# Patient Record
Sex: Male | Born: 1975 | Race: White | Hispanic: No | Marital: Married | Smoking: Never smoker
Health system: Southern US, Community
[De-identification: ages and names within clinical notes are randomized; demographics above are authoritative.]

## PROBLEM LIST (undated history)

## (undated) DIAGNOSIS — E7800 Pure hypercholesterolemia, unspecified: Secondary | ICD-10-CM

## (undated) DIAGNOSIS — J45909 Unspecified asthma, uncomplicated: Secondary | ICD-10-CM

## (undated) DIAGNOSIS — F429 Obsessive-compulsive disorder, unspecified: Secondary | ICD-10-CM

## (undated) DIAGNOSIS — J302 Other seasonal allergic rhinitis: Secondary | ICD-10-CM

## (undated) DIAGNOSIS — E559 Vitamin D deficiency, unspecified: Secondary | ICD-10-CM

## (undated) DIAGNOSIS — T7840XA Allergy, unspecified, initial encounter: Secondary | ICD-10-CM

## (undated) DIAGNOSIS — F411 Generalized anxiety disorder: Secondary | ICD-10-CM

## (undated) DIAGNOSIS — K529 Noninfective gastroenteritis and colitis, unspecified: Secondary | ICD-10-CM

## (undated) HISTORY — PX: COLONOSCOPY W/ POLYPECTOMY: SHX1380

## (undated) HISTORY — PX: OTHER SURGICAL HISTORY: SHX169

## (undated) HISTORY — DX: Generalized anxiety disorder: F41.1

## (undated) HISTORY — DX: Pure hypercholesterolemia, unspecified: E78.00

## (undated) HISTORY — DX: Obsessive-compulsive disorder, unspecified: F42.9

## (undated) HISTORY — DX: Allergy, unspecified, initial encounter: T78.40XA

## (undated) HISTORY — DX: Unspecified asthma, uncomplicated: J45.909

## (undated) HISTORY — PX: VASECTOMY: SHX75

## (undated) HISTORY — DX: Other seasonal allergic rhinitis: J30.2

## (undated) HISTORY — DX: Vitamin D deficiency, unspecified: E55.9

---
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
## 2017-12-25 HISTORY — PX: CARDIOVASCULAR STRESS TEST: SHX262

## 2018-01-09 ENCOUNTER — Ambulatory Visit (INDEPENDENT_AMBULATORY_CARE_PROVIDER_SITE_OTHER): Payer: 59 | Admitting: Family Medicine

## 2018-01-09 ENCOUNTER — Encounter: Payer: Self-pay | Admitting: Family Medicine

## 2018-01-09 VITALS — BP 133/84 | HR 65 | Temp 98.3°F | Resp 16 | Ht 64.5 in | Wt 143.0 lb

## 2018-01-09 DIAGNOSIS — R072 Precordial pain: Secondary | ICD-10-CM

## 2018-01-09 NOTE — Progress Notes (Signed)
Office Note 01/09/2018  CC:  Chief Complaint  Patient presents with  . Establish Care    HPI:  Dennis [name] is a 42 y.o. male who is here to establish care Patient's most recent primary MD: one in [place]. Old records were not reviewed prior to or during today's visit.  About 3 weeks ago he was doing vigorous exercise and about 5 min into this he began to feel mid back pain associated also with L sided CP and L arm numbness.  No jaw pain.  No nausea.  Some lightheaded feeling.  No cough, wheeze, or chest tightness.  Lasted 20 min while he continued to exercise.  Subsided after 5 min of rest.  No recurrence since.  He has not worked out anymore but mainly due to having cold virus.  Denies GERD. Has been a runner prior to the CP episode and described feeling a bit of chest discomfort at beginning but this resolved as he continued to run.  Lots of life changes in last 6 mo or so.  Past Medical History:  Diagnosis Date  . Asthma    childhood only.  Marland Kitchen GAD (generalized anxiety disorder)   . OCD (obsessive compulsive disorder)    tendencies.  ADD sx's as well.  . Seasonal allergies   . Vitamin D deficiency     Past Surgical History:  Procedure Laterality Date  . VASECTOMY    . vasectomy reverasal      Family History  Problem Relation Age of Onset  . Cancer Mother   . Depression Mother   . Stroke Mother   . Stroke Father   . Hypertension Father   . Early death Father   . Arthritis Maternal Grandmother   . Cancer Maternal Grandmother   . Early death Maternal Grandmother   . Alcohol abuse Maternal Grandfather   . Heart disease Maternal Grandfather   . Stroke Maternal Grandfather   . Cancer Paternal Grandfather   . COPD Paternal Grandfather     Social History   Socioeconomic History  . Marital status: Married    Spouse name: Not on file  . Number of children: Not on file  . Years of education: Not on file  . Highest education level: Not on file  Social  Needs  . Financial resource strain: Not on file  . Food insecurity - worry: Not on file  . Food insecurity - inability: Not on file  . Transportation needs - medical: Not on file  . Transportation needs - non-medical: Not on file  Occupational History  . Not on file  Tobacco Use  . Smoking status: Never Smoker  . Smokeless tobacco: Never Used  Substance and Sexual Activity  . Alcohol use: No    Frequency: Never  . Drug use: No  . Sexual activity: Not on file  Other Topics Concern  . Not on file  Social History Narrative   Married, 3 daughters and 1 son.  Pt w/out siblings.   Relocated to [place] from [place] 2018.   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   No T/A/Ds.    Outpatient Encounter Medications as of 01/09/2018  Medication Sig  . Cholecalciferol (VITAMIN D3) 5000 units CAPS Take by mouth.  . Docosahexaenoic Acid (DHA) 200 MG CAPS Take by mouth.  . Saccharomyces boulardii (PROBIOTIC) 250 MG CAPS Take by mouth.  . sertraline (ZOLOFT) 50 MG tablet    No facility-administered encounter medications on file as of 01/09/2018.  Not on File  ROS Review of Systems  Constitutional: Negative for fatigue and fever.  HENT: Negative for congestion and sore throat.   Eyes: Negative for visual disturbance.  Respiratory: Negative for cough.   Cardiovascular: Positive for chest pain (see hpi).  Gastrointestinal: Negative for abdominal pain and nausea.  Genitourinary: Negative for dysuria.  Musculoskeletal: Negative for back pain and joint swelling.  Skin: Negative for rash.  Neurological: Negative for weakness and headaches.  Hematological: Negative for adenopathy.    PE; Blood pressure 133/84, pulse 65, temperature 98.3 F (36.8 C), temperature source Oral, resp. rate 16, height 5' 4.5" (1.638 m), weight 143 lb (64.9 kg), SpO2 97 %. Body mass index is 24.17 kg/m.  Gen: Alert, well appearing.  Patient is oriented to person, place,  time, and situation. AFFECT: pleasant, lucid thought and speech. HWT:UUEK: no injection, icteris, swelling, or exudate.  EOMI, PERRLA. Mouth: lips without lesion/swelling.  Oral mucosa pink and moist. Oropharynx without erythema, exudate, or swelling.  CV: RRR, no m/r/g.   LUNGS: CTA bilat, nonlabored resps, good aeration in all lung fields. ABD: soft, NT, ND, BS normal. No bruits. EXT: no clubbing, cyanosis, or edema.   Pertinent labs:   12 lead EKG today: (no prior for comparison)-NSR, rate 66, normal intervals and duration, no ischemic changes or ectopy.  No hypertrophy.  ASSESSMENT AND PLAN:   New pt: no pertinent old records to obtain.  1) Chest pain, exercise induced. EKG normal today. Will obtain fasting lipids, CMET, TSH, and CBC. Will order ETT. Musculoskeletal pain is a distinct possibility but given description of the pain, will further eval (as noted above) for cardiac ischemia.  An After Visit Summary was printed and given to the patient.  Return in about 4 weeks (around 02/06/2018) for CPE (fasting not necessary)--needs lab visit for fasting labs at his earliest convenience.  Signed:  Crissie Sickles, MD           01/09/2018

## 2018-01-15 ENCOUNTER — Ambulatory Visit (INDEPENDENT_AMBULATORY_CARE_PROVIDER_SITE_OTHER): Payer: 59

## 2018-01-15 DIAGNOSIS — R072 Precordial pain: Secondary | ICD-10-CM

## 2018-01-15 LAB — EXERCISE TOLERANCE TEST
CHL CUP MPHR: 178 {beats}/min
CHL CUP RESTING HR STRESS: 64 {beats}/min
CSEPHR: 101 %
CSEPPHR: 181 {beats}/min
Estimated workload: 17.3 METS
Exercise duration (min): 14 min
Exercise duration (sec): 1 s
RPE: 17

## 2018-01-16 ENCOUNTER — Encounter: Payer: Self-pay | Admitting: Family Medicine

## 2018-01-22 ENCOUNTER — Other Ambulatory Visit (INDEPENDENT_AMBULATORY_CARE_PROVIDER_SITE_OTHER): Payer: 59

## 2018-01-22 DIAGNOSIS — R072 Precordial pain: Secondary | ICD-10-CM

## 2018-01-23 LAB — CBC WITH DIFFERENTIAL/PLATELET
BASOS ABS: 32 {cells}/uL (ref 0–200)
Basophils Relative: 0.7 %
EOS ABS: 171 {cells}/uL (ref 15–500)
Eosinophils Relative: 3.8 %
HCT: 47.3 % (ref 38.5–50.0)
Hemoglobin: 16.4 g/dL (ref 13.2–17.1)
Lymphs Abs: 1260 cells/uL (ref 850–3900)
MCH: 30.5 pg (ref 27.0–33.0)
MCHC: 34.7 g/dL (ref 32.0–36.0)
MCV: 88.1 fL (ref 80.0–100.0)
MONOS PCT: 8.7 %
MPV: 10.1 fL (ref 7.5–12.5)
Neutro Abs: 2646 cells/uL (ref 1500–7800)
Neutrophils Relative %: 58.8 %
PLATELETS: 215 10*3/uL (ref 140–400)
RBC: 5.37 10*6/uL (ref 4.20–5.80)
RDW: 11.9 % (ref 11.0–15.0)
TOTAL LYMPHOCYTE: 28 %
WBC mixed population: 392 cells/uL (ref 200–950)
WBC: 4.5 10*3/uL (ref 3.8–10.8)

## 2018-01-23 LAB — COMPREHENSIVE METABOLIC PANEL
AG RATIO: 2 (calc) (ref 1.0–2.5)
ALT: 20 U/L (ref 9–46)
AST: 22 U/L (ref 10–40)
Albumin: 4.6 g/dL (ref 3.6–5.1)
Alkaline phosphatase (APISO): 48 U/L (ref 40–115)
BUN: 17 mg/dL (ref 7–25)
CO2: 26 mmol/L (ref 20–32)
Calcium: 9.5 mg/dL (ref 8.6–10.3)
Chloride: 105 mmol/L (ref 98–110)
Creat: 0.93 mg/dL (ref 0.60–1.35)
GLUCOSE: 89 mg/dL (ref 65–99)
Globulin: 2.3 g/dL (calc) (ref 1.9–3.7)
Potassium: 4.5 mmol/L (ref 3.5–5.3)
SODIUM: 140 mmol/L (ref 135–146)
TOTAL PROTEIN: 6.9 g/dL (ref 6.1–8.1)
Total Bilirubin: 0.9 mg/dL (ref 0.2–1.2)

## 2018-01-23 LAB — LIPID PANEL
Cholesterol: 193 mg/dL (ref <200)
HDL: 42 mg/dL (ref >40)
LDL CHOLESTEROL (CALC): 134 mg/dL — AB
Non-HDL Cholesterol (Calc): 151 mg/dL (calc) — ABNORMAL HIGH (ref <130)
Total CHOL/HDL Ratio: 4.6 (calc) (ref <5.0)
Triglycerides: 75 mg/dL (ref <150)

## 2018-01-23 LAB — TSH: TSH: 1.78 m[IU]/L (ref 0.40–4.50)

## 2018-02-05 ENCOUNTER — Ambulatory Visit (INDEPENDENT_AMBULATORY_CARE_PROVIDER_SITE_OTHER): Payer: 59 | Admitting: Family Medicine

## 2018-02-05 ENCOUNTER — Encounter: Payer: Self-pay | Admitting: Family Medicine

## 2018-02-05 VITALS — BP 128/84 | HR 67 | Temp 97.8°F | Resp 15 | Ht 64.5 in | Wt 143.0 lb

## 2018-02-05 DIAGNOSIS — Z0000 Encounter for general adult medical examination without abnormal findings: Secondary | ICD-10-CM

## 2018-02-05 MED ORDER — SERTRALINE HCL 50 MG PO TABS: ORAL_TABLET | ORAL | 3 refills | Status: DC

## 2018-02-05 NOTE — Progress Notes (Signed)
Office Note 02/05/2018  CC:  Chief Complaint  Patient presents with  . Annual Exam    HPI:  Dennis [name] is a 42 y.o. White male who is here for annual health maintenance exam. He had a fasting health panel of labs 01/22/18 and it was all normal--results reviewed in detail with pt today.  Eyes: this year. Dental: preventatives UTD. Exercise: [place]--high intensity training/agility/CV/core strengthening, running ok. Diet: "paleo-ish" per pt report, gluten free.  Past Medical History:  Diagnosis Date  . Asthma    childhood only.  Marland Kitchen GAD (generalized anxiety disorder)   . OCD (obsessive compulsive disorder)    tendencies.  ADD sx's as well.  . Seasonal allergies   . Vitamin D deficiency     Past Surgical History:  Procedure Laterality Date  . CARDIOVASCULAR STRESS TEST  12/2017   ETT--NORMAL  . VASECTOMY    . vasectomy reverasal      Family History  Problem Relation Age of Onset  . Cancer Mother   . Depression Mother   . Stroke Mother   . Stroke Father   . Hypertension Father   . Early death Father   . Arthritis Maternal Grandmother   . Cancer Maternal Grandmother   . Early death Maternal Grandmother   . Alcohol abuse Maternal Grandfather   . Heart disease Maternal Grandfather   . Stroke Maternal Grandfather   . Cancer Paternal Grandfather   . COPD Paternal Grandfather     Social History   Socioeconomic History  . Marital status: Married    Spouse name: Not on file  . Number of children: Not on file  . Years of education: Not on file  . Highest education level: Not on file  Social Needs  . Financial resource strain: Not on file  . Food insecurity - worry: Not on file  . Food insecurity - inability: Not on file  . Transportation needs - medical: Not on file  . Transportation needs - non-medical: Not on file  Occupational History  . Not on file  Tobacco Use  . Smoking status: Never Smoker  . Smokeless tobacco: Never Used  Substance and Sexual  Activity  . Alcohol use: No    Frequency: Never  . Drug use: No  . Sexual activity: Not on file  Other Topics Concern  . Not on file  Social History Narrative   Married, 3 daughters and 1 son.  Pt w/out siblings.   Relocated to [place] from [place] 2018.   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   No T/A/Ds.    Outpatient Medications Prior to Visit  Medication Sig Dispense Refill  . Cholecalciferol (VITAMIN D3) 5000 units CAPS Take by mouth.    . Docosahexaenoic Acid (DHA) 200 MG CAPS Take by mouth.    . Saccharomyces boulardii (PROBIOTIC) 250 MG CAPS Take by mouth.    . sertraline (ZOLOFT) 50 MG tablet      No facility-administered medications prior to visit.     Not on File  ROS Review of Systems  Constitutional: Negative for appetite change, chills, fatigue and fever.  HENT: Negative for congestion, dental problem, ear pain and sore throat.   Eyes: Negative for discharge, redness and visual disturbance.  Respiratory: Negative for cough, chest tightness, shortness of breath and wheezing.   Cardiovascular: Negative for chest pain, palpitations and leg swelling.  Gastrointestinal: Negative for abdominal pain, blood in stool, diarrhea, nausea and vomiting.  Genitourinary: Negative for difficulty urinating, dysuria,  flank pain, frequency, hematuria and urgency.  Musculoskeletal: Negative for arthralgias, back pain, joint swelling, myalgias and neck stiffness.  Skin: Negative for pallor and rash.  Neurological: Negative for dizziness, speech difficulty, weakness and headaches.  Hematological: Negative for adenopathy. Does not bruise/bleed easily.  Psychiatric/Behavioral: Negative for confusion and sleep disturbance. The patient is not nervous/anxious.     PE; Blood pressure 128/84, pulse 67, temperature 97.8 F (36.6 C), temperature source Oral, resp. rate 15, height 5' 4.5" (1.638 m), weight 143 lb (64.9 kg), SpO2 97 %. Body mass index is  24.17 kg/m.  Gen: Alert, well appearing.  Patient is oriented to person, place, time, and situation. AFFECT: pleasant, lucid thought and speech. ENT: Ears: EACs clear, normal epithelium.  TMs with good light reflex and landmarks bilaterally.  Eyes: no injection, icteris, swelling, or exudate.  EOMI, PERRLA. Nose: no drainage or turbinate edema/swelling.  No injection or focal lesion.  Mouth: lips without lesion/swelling.  Oral mucosa pink and moist.  Dentition intact and without obvious caries or gingival swelling.  Oropharynx without erythema, exudate, or swelling.  Neck: supple/nontender.  No LAD, mass, or TM.  Carotid pulses 2+ bilaterally, without bruits. CV: RRR, no m/r/g.   LUNGS: CTA bilat, nonlabored resps, good aeration in all lung fields. ABD: soft, NT, ND, BS normal.  No hepatospenomegaly or mass.  No bruits. EXT: no clubbing, cyanosis, or edema.  Musculoskeletal: no joint swelling, erythema, warmth, or tenderness.  ROM of all joints intact. Skin - no sores or suspicious lesions or rashes or color changes   Pertinent labs:  Lab Results  Component Value Date   TSH 1.78 01/22/2018   Lab Results  Component Value Date   WBC 4.5 01/22/2018   HGB 16.4 01/22/2018   HCT 47.3 01/22/2018   MCV 88.1 01/22/2018   PLT 215 01/22/2018   Lab Results  Component Value Date   CREATININE 0.93 01/22/2018   BUN 17 01/22/2018   NA 140 01/22/2018   K 4.5 01/22/2018   CL 105 01/22/2018   CO2 26 01/22/2018   Lab Results  Component Value Date   ALT 20 01/22/2018   AST 22 01/22/2018   BILITOT 0.9 01/22/2018   Lab Results  Component Value Date   CHOL 193 01/22/2018   Lab Results  Component Value Date   HDL 42 01/22/2018   No results found for: [facility] Lab Results  Component Value Date   TRIG 75 01/22/2018   Lab Results  Component Value Date   CHOLHDL 4.6 01/22/2018    ASSESSMENT AND PLAN:   Health maintenance exam: Reviewed age and gender appropriate health maintenance  issues (prudent diet, regular exercise, health risks of tobacco and excessive alcohol, use of seatbelts, fire alarms in home, use of sunscreen).  Also reviewed age and gender appropriate health screening as well as vaccine recommendations. Vaccines: Tdap--UTD--last one was 2011.   [facility] Labs: fasting HP labs normal 01/22/18. Prostate ca screening: avg risk= start screening age 1 yrs. Colon ca screening: avg risk= start screening age 37 yrs.  An After Visit Summary was printed and given to the patient.  FOLLOW UP:  Return in about 1 year (around 02/05/2019) for annual CPE (fasting).  Signed:  Crissie Sickles, MD           02/05/2018

## 2018-02-05 NOTE — Patient Instructions (Signed)

## 2018-05-14 ENCOUNTER — Telehealth: Payer: Self-pay | Admitting: Family Medicine

## 2018-05-14 NOTE — Telephone Encounter (Signed)
SW Todd at [facility], he stated that for some reason the refills from Rx sent on 02/05/18 were deleted. He is going to print the hard copy and reenter the Rx so they can fill Rx.   Left detailed message on pts cell vm, okay per DPR  Todd at [facility] also mentioned that he sees that a Rx has been sent to [place]. I advised him that I did not see where we have sent a Rx to [facility]. He stated that he will call them to find out what is going on.

## 2018-05-14 NOTE — Telephone Encounter (Signed)
Copied from [place] [id number]. Topic: Quick Communication - Rx Refill/Question >> May 14, 2018 11:44 AM Lennox Solders wrote: Medication: sertraline 50 mg . Pt would like 90 Has the patient contacted their pharmacy? yes} ((Agent: If yes, when and what did the pharmacy advise?) per pt pharm said med was denied  Pharmacy [facility] . Pt is out Agent: Please be advised that RX refills may take up to 3 business days. We ask that you follow-up with your pharmacy.

## 2018-06-14 ENCOUNTER — Telehealth: Payer: Self-pay | Admitting: Family Medicine

## 2018-06-14 NOTE — Telephone Encounter (Signed)
Form placed on Dr. McGowens desk for review/completion.  

## 2018-06-14 NOTE — Telephone Encounter (Signed)
Patient's wife dropped off form to be complete.  Charge form attached if needed.  Patient just had CPE 2.12.19.  Patient's wife would like to be contacted when form is complete.

## 2018-06-17 NOTE — Telephone Encounter (Signed)
Form completed, no charge. Copy made for chart. Original put up front for pt/pts wife to p/u.   Left message on pts wife's cell advising form is ready for p/u.

## 2018-07-09 ENCOUNTER — Telehealth: Payer: Self-pay | Admitting: Family Medicine

## 2018-07-09 NOTE — Telephone Encounter (Signed)
Copied from [place] [id number]. Topic: Quick Communication - Rx Refill/Question >> Jul 09, 2018  4:28 PM Lysle Morales L, NT wrote: Medication:  sertraline (ZOLOFT) 50 MG tablet  patient would like this prescription  changed to 1 day for a 30 day supply  Has the patient contacted their pharmacy? yes  (Agent: If no, request that the patient contact the pharmacy for the refill.) (Agent: If yes, when and what did the pharmacy advise?  [id number] [place] [place] [phone] (Phone) [phone] (Fax)      Agent: Please be advised that RX refills may take up to 3 business days. We ask that you follow-up with your pharmacy.

## 2018-07-10 MED ORDER — SERTRALINE HCL 50 MG PO TABS: ORAL_TABLET | ORAL | 6 refills | Status: DC

## 2018-07-10 NOTE — Telephone Encounter (Signed)
OK, rx change sent in per pt request.

## 2018-07-10 NOTE — Telephone Encounter (Signed)
Medication change request: Sertraline: Patient would like this prescription  changed to 1 day for a 30 day supply.  Please advise. Thanks.

## 2018-07-10 NOTE — Telephone Encounter (Signed)
Tried calling pt but number on file is not a working number.  If pt calls back please update phone number in chart. Thanks.

## 2018-08-13 ENCOUNTER — Other Ambulatory Visit: Payer: Self-pay | Admitting: Family Medicine

## 2019-01-25 DIAGNOSIS — E7800 Pure hypercholesterolemia, unspecified: Secondary | ICD-10-CM

## 2019-01-25 HISTORY — DX: Pure hypercholesterolemia, unspecified: E78.00

## 2019-02-10 ENCOUNTER — Ambulatory Visit (INDEPENDENT_AMBULATORY_CARE_PROVIDER_SITE_OTHER): Payer: 59 | Admitting: Family Medicine

## 2019-02-10 ENCOUNTER — Encounter: Payer: Self-pay | Admitting: Family Medicine

## 2019-02-10 VITALS — BP 124/81 | HR 74 | Temp 98.1°F | Resp 16 | Ht 65.25 in | Wt 146.0 lb

## 2019-02-10 DIAGNOSIS — E559 Vitamin D deficiency, unspecified: Secondary | ICD-10-CM | POA: Diagnosis not present

## 2019-02-10 DIAGNOSIS — Z0000 Encounter for general adult medical examination without abnormal findings: Secondary | ICD-10-CM | POA: Diagnosis not present

## 2019-02-10 LAB — CBC WITH DIFFERENTIAL/PLATELET
BASOS ABS: 0 10*3/uL (ref 0.0–0.1)
Basophils Relative: 0.6 % (ref 0.0–3.0)
EOS PCT: 3.3 % (ref 0.0–5.0)
Eosinophils Absolute: 0.2 10*3/uL (ref 0.0–0.7)
HCT: 47.3 % (ref 39.0–52.0)
HEMOGLOBIN: 16.1 g/dL (ref 13.0–17.0)
LYMPHS ABS: 1.3 10*3/uL (ref 0.7–4.0)
LYMPHS PCT: 25.4 % (ref 12.0–46.0)
MCHC: 34.1 g/dL (ref 30.0–36.0)
MCV: 91 fl (ref 78.0–100.0)
MONOS PCT: 9.6 % (ref 3.0–12.0)
Monocytes Absolute: 0.5 10*3/uL (ref 0.1–1.0)
NEUTROS PCT: 61.1 % (ref 43.0–77.0)
Neutro Abs: 3.1 10*3/uL (ref 1.4–7.7)
Platelets: 229 10*3/uL (ref 150.0–400.0)
RBC: 5.19 Mil/uL (ref 4.22–5.81)
RDW: 13.3 % (ref 11.5–15.5)
WBC: 5.1 10*3/uL (ref 4.0–10.5)

## 2019-02-10 LAB — LIPID PANEL
CHOL/HDL RATIO: 5
Cholesterol: 250 mg/dL — ABNORMAL HIGH (ref 0–200)
HDL: 46.7 mg/dL (ref >39.00)
LDL Cholesterol: 176 mg/dL — ABNORMAL HIGH (ref 0–99)
NonHDL: 203.08
TRIGLYCERIDES: 133 mg/dL (ref 0.0–149.0)
VLDL: 26.6 mg/dL (ref 0.0–40.0)

## 2019-02-10 LAB — COMPREHENSIVE METABOLIC PANEL
ALK PHOS: 44 U/L (ref 39–117)
ALT: 41 U/L (ref 0–53)
AST: 26 U/L (ref 0–37)
Albumin: 4.5 g/dL (ref 3.5–5.2)
BILIRUBIN TOTAL: 1 mg/dL (ref 0.2–1.2)
BUN: 17 mg/dL (ref 6–23)
CALCIUM: 10.1 mg/dL (ref 8.4–10.5)
CO2: 30 meq/L (ref 19–32)
CREATININE: 0.99 mg/dL (ref 0.40–1.50)
Chloride: 103 mEq/L (ref 96–112)
GFR: 82.36 mL/min (ref >60.00)
Glucose, Bld: 82 mg/dL (ref 70–99)
Potassium: 4.8 mEq/L (ref 3.5–5.1)
Sodium: 140 mEq/L (ref 135–145)
TOTAL PROTEIN: 6.8 g/dL (ref 6.0–8.3)

## 2019-02-10 NOTE — Progress Notes (Signed)
Office Note 02/10/2019  CC:  Chief Complaint  Patient presents with  . Annual Exam    pt is not fasting    HPI:  Dennis [name] is a 43 y.o. White male who is here for annual health maintenance exam.  Exercise: crossfit 3 days/week Diet: healthy diet; gluten free; minimal fast food.  Recent tough time with his mom and her dementia/psych problems x 6 mo.  Has had lots of anxiety and trouble sleeping. Grieving b/c mom's issues.  Has good support network.  Past Medical History:  Diagnosis Date  . Asthma    childhood only.  Marland Kitchen GAD (generalized anxiety disorder)   . OCD (obsessive compulsive disorder)    tendencies.  ADD sx's as well.  . Seasonal allergies   . Vitamin D deficiency     Past Surgical History:  Procedure Laterality Date  . CARDIOVASCULAR STRESS TEST  12/2017   ETT--NORMAL  . VASECTOMY    . vasectomy reverasal      Family History  Problem Relation Age of Onset  . Cancer Mother   . Depression Mother   . Stroke Mother   . Stroke Father   . Hypertension Father   . Early death Father   . Arthritis Maternal Grandmother   . Cancer Maternal Grandmother   . Early death Maternal Grandmother   . Alcohol abuse Maternal Grandfather   . Heart disease Maternal Grandfather   . Stroke Maternal Grandfather   . Cancer Paternal Grandfather   . COPD Paternal Grandfather     Social History   Socioeconomic History  . Marital status: Married    Spouse name: Not on file  . Number of children: Not on file  . Years of education: Not on file  . Highest education level: Not on file  Occupational History  . Not on file  Social Needs  . Financial resource strain: Not on file  . Food insecurity:    Worry: Not on file    Inability: Not on file  . Transportation needs:    Medical: Not on file    Non-medical: Not on file  Tobacco Use  . Smoking status: Never Smoker  . Smokeless tobacco: Never Used  Substance and Sexual Activity  . Alcohol use: No    Frequency:  Never  . Drug use: No  . Sexual activity: Not on file  Lifestyle  . Physical activity:    Days per week: Not on file    Minutes per session: Not on file  . Stress: Not on file  Relationships  . Social connections:    Talks on phone: Not on file    Gets together: Not on file    Attends religious service: Not on file    Active member of club or organization: Not on file    Attends meetings of clubs or organizations: Not on file    Relationship status: Not on file  . Intimate partner violence:    Fear of current or ex partner: Not on file    Emotionally abused: Not on file    Physically abused: Not on file    Forced sexual activity: Not on file  Other Topics Concern  . Not on file  Social History Narrative   Married, 3 daughters and 1 son.  Pt w/out siblings.   Relocated to [place] from [place] 2018.   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    Outpatient Medications Prior to Visit  Medication  Sig Dispense Refill  . Cholecalciferol (VITAMIN D3) 5000 units CAPS Take by mouth.    . Docosahexaenoic Acid (DHA) 200 MG CAPS Take by mouth.    . Saccharomyces boulardii (PROBIOTIC) 250 MG CAPS Take by mouth.    . sertraline (ZOLOFT) 50 MG tablet TAKE 1 TABLET BY MOUTH EVERY DAY 90 tablet 1   No facility-administered medications prior to visit.     No Known Allergies  ROS Review of Systems  Constitutional: Negative for appetite change, chills, fatigue and fever.  HENT: Negative for congestion, dental problem, ear pain and sore throat.   Eyes: Negative for discharge, redness and visual disturbance.  Respiratory: Negative for cough, chest tightness, shortness of breath and wheezing.   Cardiovascular: Negative for chest pain, palpitations and leg swelling.  Gastrointestinal: Negative for abdominal pain, blood in stool, diarrhea, nausea and vomiting.  Genitourinary: Negative for difficulty urinating, dysuria, flank pain, frequency,  hematuria and urgency.  Musculoskeletal: Negative for arthralgias, back pain, joint swelling, myalgias and neck stiffness.  Skin: Negative for pallor and rash.  Neurological: Negative for dizziness, speech difficulty, weakness and headaches.  Hematological: Negative for adenopathy. Does not bruise/bleed easily.  Psychiatric/Behavioral: Negative for confusion and sleep disturbance. The patient is not nervous/anxious.     PE; Blood pressure 124/81, pulse 74, temperature 98.1 F (36.7 C), temperature source Oral, resp. rate 16, height 5' 5.25" (1.657 m), weight 146 lb (66.2 kg), SpO2 99 %. Body mass index is 24.11 kg/m.  Gen: Alert, well appearing.  Patient is oriented to person, place, time, and situation. AFFECT: pleasant, lucid thought and speech. ENT: Ears: EACs clear, normal epithelium.  TMs with good light reflex and landmarks bilaterally.  Eyes: no injection, icteris, swelling, or exudate.  EOMI, PERRLA. Nose: no drainage or turbinate edema/swelling.  No injection or focal lesion.  Mouth: lips without lesion/swelling.  Oral mucosa pink and moist.  Dentition intact and without obvious caries or gingival swelling.  Oropharynx without erythema, exudate, or swelling.  Neck: supple/nontender.  No LAD, mass, or TM.  Carotid pulses 2+ bilaterally, without bruits. CV: RRR, no m/r/g.   LUNGS: CTA bilat, nonlabored resps, good aeration in all lung fields. ABD: soft, NT, ND, BS normal.  No hepatospenomegaly or mass.  No bruits. EXT: no clubbing, cyanosis, or edema.  Musculoskeletal: no joint swelling, erythema, warmth, or tenderness.  ROM of all joints intact. Skin - no sores or suspicious lesions or rashes or color changes  Pertinent labs:  Lab Results  Component Value Date   TSH 1.78 01/22/2018   Lab Results  Component Value Date   WBC 4.5 01/22/2018   HGB 16.4 01/22/2018   HCT 47.3 01/22/2018   MCV 88.1 01/22/2018   PLT 215 01/22/2018   Lab Results  Component Value Date    CREATININE 0.93 01/22/2018   BUN 17 01/22/2018   NA 140 01/22/2018   K 4.5 01/22/2018   CL 105 01/22/2018   CO2 26 01/22/2018   Lab Results  Component Value Date   ALT 20 01/22/2018   AST 22 01/22/2018   BILITOT 0.9 01/22/2018   Lab Results  Component Value Date   CHOL 193 01/22/2018   Lab Results  Component Value Date   HDL 42 01/22/2018   Lab Results  Component Value Date   LDLCALC 134 (H) 01/22/2018   Lab Results  Component Value Date   TRIG 75 01/22/2018   Lab Results  Component Value Date   CHOLHDL 4.6 01/22/2018  ASSESSMENT AND PLAN:   Health maintenance exam: Reviewed age and gender appropriate health maintenance issues (prudent diet, regular exercise, health risks of tobacco and excessive alcohol, use of seatbelts, fire alarms in home, use of sunscreen).  Also reviewed age and gender appropriate health screening as well as vaccine recommendations. Vaccines: Flu vaccine->UTD 09/2018. Labs: fasting HP labs + vit D level. Prostate ca screening: average risk patient= as per latest guidelines, start screening at 45-50 yrs of age. Colon ca screening: average risk patient= as per latest guidelines, start screening at 45-50 yrs of age.  GAD/depression: recent life circumstances that he's been dealing with have been VERY hard to deal with. He seems to be taking advantage of his support network very well: Systems developer, church, small group, wife, kids. He is considering an increase in his zoloft and will get back with me if needed regarding this issue.  An After Visit Summary was printed and given to the patient.  FOLLOW UP:  Return in about 1 year (around 02/11/2020) for annual CPE (fasting).  Signed:  Crissie Sickles, MD           02/10/2019

## 2019-02-10 NOTE — Patient Instructions (Signed)

## 2019-02-12 ENCOUNTER — Encounter: Payer: Self-pay | Admitting: Family Medicine

## 2019-02-12 LAB — VITAMIN D 25 HYDROXY (VIT D DEFICIENCY, FRACTURES): VITD: 21.89 ng/mL — ABNORMAL LOW (ref 30.00–100.00)

## 2019-02-12 LAB — TSH: TSH: 1.68 u[IU]/mL (ref 0.35–4.50)

## 2019-02-13 ENCOUNTER — Telehealth: Payer: Self-pay | Admitting: Family Medicine

## 2019-02-13 NOTE — Telephone Encounter (Signed)
Pt given results per notes of Dr Anitra Lauth on 02/12/19.Unable to document in result note due to result note not being routed to [facility]. In result note was ordered to make a lab appot in a week. Pt wanted to know what lab work he wanted and why recheck in a week.

## 2019-02-13 NOTE — Telephone Encounter (Signed)
See result note.  

## 2019-03-06 ENCOUNTER — Other Ambulatory Visit: Payer: Self-pay | Admitting: Family Medicine

## 2019-08-26 DIAGNOSIS — K529 Noninfective gastroenteritis and colitis, unspecified: Secondary | ICD-10-CM

## 2019-08-26 HISTORY — DX: Noninfective gastroenteritis and colitis, unspecified: K52.9

## 2019-08-31 ENCOUNTER — Emergency Department (HOSPITAL_BASED_OUTPATIENT_CLINIC_OR_DEPARTMENT_OTHER)
Admission: EM | Admit: 2019-08-31 | Discharge: 2019-08-31 | Disposition: A | Discharge status: Home / Self Care | Payer: No Typology Code available for payment source | Attending: Emergency Medicine | Admitting: Emergency Medicine

## 2019-08-31 ENCOUNTER — Emergency Department (HOSPITAL_BASED_OUTPATIENT_CLINIC_OR_DEPARTMENT_OTHER): Payer: No Typology Code available for payment source

## 2019-08-31 ENCOUNTER — Encounter (HOSPITAL_BASED_OUTPATIENT_CLINIC_OR_DEPARTMENT_OTHER): Payer: Self-pay | Admitting: Adult Health

## 2019-08-31 ENCOUNTER — Other Ambulatory Visit: Payer: Self-pay

## 2019-08-31 DIAGNOSIS — K529 Noninfective gastroenteritis and colitis, unspecified: Secondary | ICD-10-CM

## 2019-08-31 DIAGNOSIS — Z20828 Contact with and (suspected) exposure to other viral communicable diseases: Secondary | ICD-10-CM | POA: Insufficient documentation

## 2019-08-31 DIAGNOSIS — R197 Diarrhea, unspecified: Secondary | ICD-10-CM | POA: Diagnosis present

## 2019-08-31 LAB — COMPREHENSIVE METABOLIC PANEL
ALT: 22 U/L (ref 0–44)
AST: 22 U/L (ref 15–41)
Albumin: 4.2 g/dL (ref 3.5–5.0)
Alkaline Phosphatase: 46 U/L (ref 38–126)
Anion gap: 11 (ref 5–15)
BUN: 10 mg/dL (ref 6–20)
CO2: 28 mmol/L (ref 22–32)
Calcium: 9.6 mg/dL (ref 8.9–10.3)
Chloride: 98 mmol/L (ref 98–111)
Creatinine, Ser: 1.09 mg/dL (ref 0.61–1.24)
GFR calc Af Amer: >60 mL/min (ref >60)
GFR calc non Af Amer: >60 mL/min (ref >60)
Glucose, Bld: 108 mg/dL — ABNORMAL HIGH (ref 70–99)
Potassium: 4.2 mmol/L (ref 3.5–5.1)
Sodium: 137 mmol/L (ref 135–145)
Total Bilirubin: 1.1 mg/dL (ref 0.3–1.2)
Total Protein: 7.6 g/dL (ref 6.5–8.1)

## 2019-08-31 LAB — CBC WITH DIFFERENTIAL/PLATELET
Abs Immature Granulocytes: 0.01 10*3/uL (ref 0.00–0.07)
Basophils Absolute: 0 10*3/uL (ref 0.0–0.1)
Basophils Relative: 0 %
Eosinophils Absolute: 0.2 10*3/uL (ref 0.0–0.5)
Eosinophils Relative: 3 %
HCT: 48.5 % (ref 39.0–52.0)
Hemoglobin: 15.9 g/dL (ref 13.0–17.0)
Immature Granulocytes: 0 %
Lymphocytes Relative: 13 %
Lymphs Abs: 0.9 10*3/uL (ref 0.7–4.0)
MCH: 29.9 pg (ref 26.0–34.0)
MCHC: 32.8 g/dL (ref 30.0–36.0)
MCV: 91.2 fL (ref 80.0–100.0)
Monocytes Absolute: 1.2 10*3/uL — ABNORMAL HIGH (ref 0.1–1.0)
Monocytes Relative: 17 %
Neutro Abs: 4.6 10*3/uL (ref 1.7–7.7)
Neutrophils Relative %: 67 %
Platelets: 179 10*3/uL (ref 150–400)
RBC: 5.32 MIL/uL (ref 4.22–5.81)
RDW: 12.1 % (ref 11.5–15.5)
WBC: 6.8 10*3/uL (ref 4.0–10.5)
nRBC: 0 % (ref 0.0–0.2)

## 2019-08-31 LAB — LIPASE, BLOOD: Lipase: 32 U/L (ref 11–51)

## 2019-08-31 MED ORDER — PROMETHAZINE HCL 25 MG/ML IJ SOLN
INTRAMUSCULAR | Status: AC
  Filled 2019-08-31: qty 1

## 2019-08-31 MED ORDER — PROMETHAZINE HCL 25 MG/ML IJ SOLN
12.5000 mg | Freq: Once | INTRAMUSCULAR | Status: AC
  Administered 2019-08-31: 12.5 mg via INTRAVENOUS

## 2019-08-31 MED ORDER — CIPROFLOXACIN HCL 500 MG PO TABS: 500.0000 mg | ORAL_TABLET | Freq: Two times a day (BID) | ORAL | 0 refills | Status: DC

## 2019-08-31 MED ORDER — ONDANSETRON HCL 4 MG PO TABS: 4.0000 mg | ORAL_TABLET | Freq: Four times a day (QID) | ORAL | 0 refills | Status: DC

## 2019-08-31 MED ORDER — IOHEXOL 300 MG/ML  SOLN
100.0000 mL | Freq: Once | INTRAMUSCULAR | Status: AC | PRN
  Administered 2019-08-31: 100 mL via INTRAVENOUS

## 2019-08-31 MED ORDER — ONDANSETRON HCL 4 MG/2ML IJ SOLN
4.0000 mg | Freq: Once | INTRAMUSCULAR | Status: AC
  Administered 2019-08-31: 4 mg via INTRAVENOUS
  Filled 2019-08-31: qty 2

## 2019-08-31 MED ORDER — SODIUM CHLORIDE 0.9 % IV BOLUS
1000.0000 mL | Freq: Once | INTRAVENOUS | Status: AC
  Administered 2019-08-31: 08:00:00 1000 mL via INTRAVENOUS

## 2019-08-31 NOTE — ED Triage Notes (Signed)
Present with abdominal pain, diarrhea and body aches since Thursday evening. He describes the pain as stabbing and to the left of the umbilicus.

## 2019-08-31 NOTE — Discharge Instructions (Addendum)
Recommend using the prescribe Zofran as needed for nausea and pain.  Additionally can use Tylenol, Motrin for pain control.  Recommend taking the antibiotic as prescribed.  Please call your primary doctor on Tuesday to schedule close recheck to be seen this week.  If you are continuing to have symptoms for the next few days, you may need additional testing and referral to gastroenterology.  If you have worsening diarrhea, worsening pain, vomiting, please return to the emergency department for recheck in the meantime.

## 2019-08-31 NOTE — ED Provider Notes (Addendum)
[facility] EMERGENCY DEPARTMENT Provider Note   CSN: [id number] Arrival date & time: 08/31/19  [id number]     History   Chief Complaint Chief Complaint  Patient presents with  . Abdominal Pain    HPI Dennis [name] is a 43 y.o. male.  Presents emergency department with chief complaint diarrhea, body aches, abdominal pain, chills.  Symptoms since Thursday evening.  States yesterday had many bowel movements, cannot recall exact number, around 10, very loose, nonbloody, no tarry.  Feels like his bottom is raw and noted small blood while wiping.  Yesterday had low-grade temperature, felt feverish and had chills and broke out in sweats.  Has tolerated small p.o. but has had very decreased appetite.  Has had some nausea but no vomiting.  Has had intermittent abdominal pain, across his abdomen left middle and right side, stabbing, moderate in severity, no alleviating or aggravating factors.  Has taken a dose of ibuprofen with some relief.  Currently not experiencing any significant pain though.  No recent antibiotic use, no recent hospitalizations, no medical comorbidities.  No known code exposures, went out to eat on Tuesday, ate tuna but said has had this food many times at this restaurant without any issues.  No recent travel.  PMH: no significant choronic medical problems  SH: works from home, lives at home, nonsmoker  FH: aunt has UC, no other family members with IBD     HPI  Past Medical History:  Diagnosis Date  . Asthma    childhood only.  Marland Kitchen GAD (generalized anxiety disorder)   . Hypercholesterolemia 01/2019   10 yr framingham risk= 2.5%.  . OCD (obsessive compulsive disorder)    tendencies.  ADD sx's as well.  . Seasonal allergies   . Vitamin D deficiency     There are no active problems to display for this patient.   Past Surgical History:  Procedure Laterality Date  . CARDIOVASCULAR STRESS TEST  12/2017   ETT--NORMAL  . VASECTOMY    . vasectomy reverasal          Home Medications    Prior to Admission medications   Medication Sig Start Date End Date Taking? Authorizing Provider  Cholecalciferol (VITAMIN D3) 5000 units CAPS Take by mouth.    [provider]  Docosahexaenoic Acid (DHA) 200 MG CAPS Take by mouth.    [provider]  Saccharomyces boulardii (PROBIOTIC) 250 MG CAPS Take by mouth.    [provider]  sertraline (ZOLOFT) 50 MG tablet TAKE 1 TABLET BY MOUTH EVERY DAY 03/06/19   [name], [name], MD    Family History Family History  Problem Relation Age of Onset  . Cancer Mother   . Depression Mother   . Stroke Mother   . Stroke Father   . Hypertension Father   . Early death Father   . Arthritis Maternal Grandmother   . Cancer Maternal Grandmother   . Early death Maternal Grandmother   . Alcohol abuse Maternal Grandfather   . Heart disease Maternal Grandfather   . Stroke Maternal Grandfather   . Cancer Paternal Grandfather   . COPD Paternal Grandfather     Social History Social History   Tobacco Use  . Smoking status: Never Smoker  . Smokeless tobacco: Never Used  Substance Use Topics  . Alcohol use: No    Frequency: Never  . Drug use: No     Allergies   Patient has no known allergies.   Review of Systems Review of Systems  Constitutional: Positive for chills and fever.  HENT: Negative for ear pain and sore throat.   Eyes: Negative for pain and visual disturbance.  Respiratory: Negative for cough and shortness of breath.   Cardiovascular: Negative for chest pain and palpitations.  Gastrointestinal: Positive for abdominal pain and diarrhea. Negative for vomiting.  Genitourinary: Negative for dysuria and hematuria.  Musculoskeletal: Negative for arthralgias and back pain.  Skin: Negative for color change and rash.  Neurological: Negative for seizures and syncope.  All other systems reviewed and are negative.    Physical Exam Updated Vital Signs BP (!) 149/98   Pulse 91    Temp 98.1 F (36.7 C) (Oral)   Resp 18   Ht 5\' 5"  (1.651 m)   Wt 63.5 kg   SpO2 97%   BMI 23.30 kg/m   Physical Exam Vitals signs and nursing note reviewed.  Constitutional:      Appearance: He is well-developed.  HENT:     Head: Normocephalic and atraumatic.  Eyes:     Conjunctiva/sclera: Conjunctivae normal.  Neck:     Musculoskeletal: Neck supple.  Cardiovascular:     Rate and Rhythm: Normal rate and regular rhythm.     Heart sounds: No murmur.  Pulmonary:     Effort: Pulmonary effort is normal. No respiratory distress.     Breath sounds: Normal breath sounds.  Abdominal:     Palpations: Abdomen is soft.     Tenderness: There is abdominal tenderness in the right lower quadrant and left lower quadrant.  Skin:    General: Skin is warm and dry.     Capillary Refill: Capillary refill takes less than 2 seconds.  Neurological:     General: No focal deficit present.     Mental Status: He is alert and oriented to person, place, and time.      ED Treatments / Results  Labs (all labs ordered are listed, but only abnormal results are displayed) Labs Reviewed  CBC WITH DIFFERENTIAL/PLATELET - Abnormal; Notable for the following components:      Result Value   Monocytes Absolute 1.2 (*)    All other components within normal limits  COMPREHENSIVE METABOLIC PANEL  LIPASE, BLOOD    EKG None  Radiology No results found.  Procedures Procedures (including critical care time)  Medications Ordered in ED Medications  ondansetron (ZOFRAN) injection 4 mg (4 mg Intravenous Given 08/31/19 0655)     Initial Impression / Assessment and Plan / ED Course  I have reviewed the triage vital signs and the nursing notes.  Pertinent labs & imaging results that were available during my care of the patient were reviewed by me and considered in my medical decision making (see chart for details).  Clinical Course as of Aug 30 909  Sun Aug 31, 2019  0713 Assessed patient,    [RD]   0910 Rechecked patient, discussed results,    [RD]    Clinical Course User Index [RD] Lucrezia Starch, MD       43 year old otherwise healthy male presents emergency department with diarrhea, abdominal discomfort.  1 episode scant blood on toilet paper but otherwise brown stool, no blood noted in stool.  On exam patient was well-appearing with normal vital signs.  Noted some tenderness across his abdomen but no rebound or guarding.  Labs were within normal limits.  CT scan concerning for significant colitis.  Suspect most likely infectious.  Other etiology considered though was ulcerative colitis.  Discussed risk and benefits of antibiotics  versus conservative management and watchful waiting.  Will cover with course of antibiotics.  Recommend close recheck with PCP early this week.  Should patient have recurrent episode or persistent symptoms, may need referral to gastroenterology and additional testing to evaluate possibility of ulcerative colitis, provided this information and instructions to patient.  Tolerating PO, symptoms controlled, soft abdomen, appropriate for outpatient management at this time.   Though less likely, given reported body aches and possible fever accompanying GI symptoms, sent COVID testing.    After the discussed management above, the patient was determined to be safe for discharge.  The patient was in agreement with this plan and all questions regarding their care were answered.  ED return precautions were discussed and the patient will return to the ED with any significant worsening of condition.    Final Clinical Impressions(s) / ED Diagnoses   Final diagnoses:  Colitis  Diarrhea of presumed infectious origin    ED Discharge Orders    None       Lucrezia Starch, MD 08/31/19 0920    Lucrezia Starch, MD 08/31/19 [phone]

## 2019-08-31 NOTE — ED Notes (Signed)
ED Provider at bedside. 

## 2019-09-04 ENCOUNTER — Ambulatory Visit (INDEPENDENT_AMBULATORY_CARE_PROVIDER_SITE_OTHER): Payer: 59 | Admitting: Internal Medicine

## 2019-09-04 DIAGNOSIS — K529 Noninfective gastroenteritis and colitis, unspecified: Secondary | ICD-10-CM

## 2019-09-04 LAB — NOVEL CORONAVIRUS, NAA (HOSP ORDER, SEND-OUT TO REF LAB; TAT 18-24 HRS): SARS-CoV-2, NAA: NOT DETECTED

## 2019-09-04 MED ORDER — ONDANSETRON HCL 4 MG PO TABS: 4.0000 mg | ORAL_TABLET | Freq: Four times a day (QID) | ORAL | 0 refills | Status: DC

## 2019-09-04 MED ORDER — CIPROFLOXACIN HCL 500 MG PO TABS: 500.0000 mg | ORAL_TABLET | Freq: Two times a day (BID) | ORAL | 0 refills | Status: AC

## 2019-09-04 MED ORDER — METRONIDAZOLE 500 MG PO TABS: 500.0000 mg | ORAL_TABLET | Freq: Three times a day (TID) | ORAL | 0 refills | Status: DC

## 2019-09-04 NOTE — Progress Notes (Signed)
Subjective:    Patient ID: Dennis [name], male    DOB: 1976/11/27, 43 y.o.   MRN: [id number]  DOS:  09/04/2019 Type of visit - description: Virtual Visit via Video Note  I connected with@   by a video enabled telemedicine application and verified that I am speaking with the correct person using two identifiers.   THIS ENCOUNTER IS A VIRTUAL VISIT DUE TO COVID-19 - PATIENT WAS NOT SEEN IN THE OFFICE. PATIENT HAS CONSENTED TO VIRTUAL VISIT / TELEMEDICINE VISIT   Location of patient: home  Location of provider: office  I discussed the limitations of evaluation and management by telemedicine and the availability of in person appointments. The patient expressed understanding and agreed to proceed.  History of Present Illness: Acute visit Symptoms started approximately 08/28/2019 with mild fever up to 99.3, severe abdominal pain, watery diarrhea, multiple bowel movements. The diarrhea was no bloody but when he wiped he did see traces of blood. Went to the ER 08/31/2019, CBC, CMP lipase COVID-19 were all negative or normal. CT showed diffuse colitis worse on the right. Was prescribed Cipro. He is here because he is not doing much better: Continue with diarrhea, it has slow down very little. Abdominal pain is about the same He is forcing himself to drink fluids. No further fever.    Review of Systems  Denies lack of the sense of smell or taste Minimal cough No difficulty breathing Does not recall taking any antibiotics prior to the onset of symptoms Has not eaten anything unusual except tuna salad 10 days prior to the onset of symptoms. No other family members are affected.  Past Medical History:  Diagnosis Date  . Asthma    childhood only.  Marland Kitchen GAD (generalized anxiety disorder)   . Hypercholesterolemia 01/2019   10 yr framingham risk= 2.5%.  . OCD (obsessive compulsive disorder)    tendencies.  ADD sx's as well.  . Seasonal allergies   . Vitamin D deficiency     Past  Surgical History:  Procedure Laterality Date  . CARDIOVASCULAR STRESS TEST  12/2017   ETT--NORMAL  . VASECTOMY    . vasectomy reverasal      Social History   Socioeconomic History  . Marital status: Married    Spouse name: Not on file  . Number of children: Not on file  . Years of education: Not on file  . Highest education level: Not on file  Occupational History  . Not on file  Social Needs  . Financial resource strain: Not on file  . Food insecurity    Worry: Not on file    Inability: Not on file  . Transportation needs    Medical: Not on file    Non-medical: Not on file  Tobacco Use  . Smoking status: Never Smoker  . Smokeless tobacco: Never Used  Substance and Sexual Activity  . Alcohol use: No    Frequency: Never  . Drug use: No  . Sexual activity: Not on file  Lifestyle  . Physical activity    Days per week: Not on file    Minutes per session: Not on file  . Stress: Not on file  Relationships  . Social Herbalist on phone: Not on file    Gets together: Not on file    Attends religious service: Not on file    Active member of club or organization: Not on file    Attends meetings of clubs or organizations: Not on  file    Relationship status: Not on file  . Intimate partner violence    Fear of current or ex partner: Not on file    Emotionally abused: Not on file    Physically abused: Not on file    Forced sexual activity: Not on file  Other Topics Concern  . Not on file  Social History Narrative   Married, 3 daughters and 1 son.  Pt w/out siblings.   Relocated to [place] from [place] 2018.   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   No T/A/Ds.      Allergies as of 09/04/2019   No Known Allergies     Medication List       Accurate as of September 04, 2019  2:53 PM. If you have any questions, ask your nurse or doctor.        ciprofloxacin 500 MG tablet Commonly known as: CIPRO Take 1 tablet (500  mg total) by mouth 2 (two) times daily for 7 days.   DHA 200 MG Caps Take by mouth.   ondansetron 4 MG tablet Commonly known as: ZOFRAN Take 1 tablet (4 mg total) by mouth every 6 (six) hours.   Probiotic 250 MG Caps Take by mouth.   sertraline 50 MG tablet Commonly known as: ZOLOFT TAKE 1 TABLET BY MOUTH EVERY DAY   Vitamin D3 125 MCG (5000 UT) Caps Take by mouth.           Objective:   Physical Exam There were no vitals taken for this visit. This is a virtual video visit.  The patient is alert oriented x3, he looks tired but is in no distress.    Assessment   43 year old gentleman, previously healthy, presents with  Colitis: As described above, documented by CT, it is diffuse and worse on the right. Not responding well to Cipro. Plan: Continue good hydration and a bland diet.  Avoid antidiarrheal medication except possibly Pepto-Bismol Extend Cipro for 3 additional days 1 week of Flagyl Schedule for tomorrow CBC, CMP and a stool sample for GI pathogens (if possible) Refill Zofran ER if symptoms increase GI referral, given age and diffuse colitis he might need a endoscopy. All of the above carefully discussed with the patient and he verbalized understanding    I discussed the assessment and treatment plan with the patient. The patient was provided an opportunity to ask questions and all were answered. The patient agreed with the plan and demonstrated an understanding of the instructions.   The patient was advised to call back or seek an in-person evaluation if the symptoms worsen or if the condition fails to improve as anticipated.

## 2019-09-10 ENCOUNTER — Ambulatory Visit: Payer: 59 | Admitting: Family Medicine

## 2019-09-17 ENCOUNTER — Other Ambulatory Visit: Payer: Self-pay | Admitting: Family Medicine

## 2019-09-27 ENCOUNTER — Encounter: Payer: Self-pay | Admitting: Family Medicine

## 2019-10-06 ENCOUNTER — Encounter: Payer: Self-pay | Admitting: Internal Medicine

## 2019-10-08 ENCOUNTER — Encounter: Payer: Self-pay | Admitting: Family Medicine

## 2020-02-12 ENCOUNTER — Encounter: Payer: 59 | Admitting: Family Medicine

## 2020-03-05 ENCOUNTER — Ambulatory Visit (INDEPENDENT_AMBULATORY_CARE_PROVIDER_SITE_OTHER): Payer: 59 | Admitting: Family Medicine

## 2020-03-05 ENCOUNTER — Encounter: Payer: Self-pay | Admitting: Family Medicine

## 2020-03-05 ENCOUNTER — Other Ambulatory Visit: Payer: Self-pay

## 2020-03-05 VITALS — BP 122/79 | HR 65 | Temp 98.5°F | Resp 16 | Ht 65.0 in | Wt 148.6 lb

## 2020-03-05 DIAGNOSIS — Z1211 Encounter for screening for malignant neoplasm of colon: Secondary | ICD-10-CM

## 2020-03-05 DIAGNOSIS — Z23 Encounter for immunization: Secondary | ICD-10-CM | POA: Diagnosis not present

## 2020-03-05 DIAGNOSIS — K429 Umbilical hernia without obstruction or gangrene: Secondary | ICD-10-CM | POA: Diagnosis not present

## 2020-03-05 DIAGNOSIS — K644 Residual hemorrhoidal skin tags: Secondary | ICD-10-CM | POA: Diagnosis not present

## 2020-03-05 DIAGNOSIS — Z0000 Encounter for general adult medical examination without abnormal findings: Secondary | ICD-10-CM | POA: Diagnosis not present

## 2020-03-05 DIAGNOSIS — F419 Anxiety disorder, unspecified: Secondary | ICD-10-CM

## 2020-03-05 DIAGNOSIS — E785 Hyperlipidemia, unspecified: Secondary | ICD-10-CM

## 2020-03-05 MED ORDER — HYDROCORTISONE (PERIANAL) 2.5 % EX CREA: 1.0000 "application " | TOPICAL_CREAM | Freq: Two times a day (BID) | CUTANEOUS | 0 refills | Status: DC

## 2020-03-05 NOTE — Addendum Note (Signed)
Addended by: Deveron Furlong D on: 03/05/2020 10:55 AM   Modules accepted: Orders

## 2020-03-05 NOTE — Patient Instructions (Signed)

## 2020-03-05 NOTE — Progress Notes (Signed)
Office Note 03/05/2020  CC:  Chief Complaint  Patient presents with  . Annual Exam    pt is not fasting    HPI:  Dennis [name] is a 44 y.o. White male who is here for annual health maintenance exam.  ED for abd pain and diarrhea 08/2019, CT showed colitis, suspected to be infectious, cipro rx'd. Followed up with Dr. Larose Kells a few days later b/c not improving.  Cipro continued, flagyl added, orders for CBC, CMET, and stool studies as well as GI referral. Pt did not make appt to see GI.  Labs still showing in EMR as ordered---it appears he never got these done. When asked about this illness today, he states that he thinks he had covid (he did have dry cough at that time as well as compete lethargy)---he has not had any similar sx's since that time.    Doing ok still on sertraline. Has umbil hernia, hurts some when he does heavy lifting and sometimes when just turning torso abruptly or rolling over in bed.  He thinks it is slowly getting bigger.  Eating fine, no n/v/d. Has external hemorrhoid that stings, was having BRBPR but this has stopped. No otc meds tried.  Past Medical History:  Diagnosis Date  . Acute colitis 08/2019   CT.  Presumed infectious.  . Asthma    childhood only.  Marland Kitchen GAD (generalized anxiety disorder)   . Hypercholesterolemia 01/2019   10 yr framingham risk= 2.5%.  . OCD (obsessive compulsive disorder)    tendencies.  ADD sx's as well.  . Seasonal allergies   . Vitamin D deficiency     Past Surgical History:  Procedure Laterality Date  . CARDIOVASCULAR STRESS TEST  12/2017   ETT--NORMAL  . VASECTOMY    . vasectomy reverasal      Family History  Problem Relation Age of Onset  . Cancer Mother   . Depression Mother   . Stroke Mother   . Stroke Father   . Hypertension Father   . Early death Father   . Arthritis Maternal Grandmother   . Cancer Maternal Grandmother   . Early death Maternal Grandmother   . Alcohol abuse Maternal Grandfather   . Heart  disease Maternal Grandfather   . Stroke Maternal Grandfather   . Cancer Paternal Grandfather   . COPD Paternal Grandfather     Social History   Socioeconomic History  . Marital status: Married    Spouse name: Not on file  . Number of children: Not on file  . Years of education: Not on file  . Highest education level: Not on file  Occupational History  . Not on file  Tobacco Use  . Smoking status: Never Smoker  . Smokeless tobacco: Never Used  Substance and Sexual Activity  . Alcohol use: No  . Drug use: No  . Sexual activity: Not on file  Other Topics Concern  . Not on file  Social History Narrative   Married, 3 daughters and 1 son.  Pt w/out siblings.   Relocated to [place] from [place] 2018.   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   No T/A/Ds.   Social Determinants of Health   Financial Resource Strain:   . Difficulty of Paying Living Expenses:   Food Insecurity:   . Worried About Charity fundraiser in the Last Year:   . Arboriculturist in the Last Year:   Transportation Needs:   . Lack of Transportation (  Medical):   Marland Kitchen Lack of Transportation (Non-Medical):   Physical Activity:   . Days of Exercise per Week:   . Minutes of Exercise per Session:   Stress:   . Feeling of Stress :   Social Connections:   . Frequency of Communication with Friends and Family:   . Frequency of Social Gatherings with Friends and Family:   . Attends Religious Services:   . Active Member of Clubs or Organizations:   . Attends Archivist Meetings:   Marland Kitchen Marital Status:   Intimate Partner Violence:   . Fear of Current or Ex-Partner:   . Emotionally Abused:   Marland Kitchen Physically Abused:   . Sexually Abused:     Outpatient Medications Prior to Visit  Medication Sig Dispense Refill  . Cholecalciferol (VITAMIN D3) 5000 units CAPS Take by mouth.    . Docosahexaenoic Acid (DHA) 200 MG CAPS Take by mouth.    . Saccharomyces boulardii (PROBIOTIC)  250 MG CAPS Take by mouth.    . sertraline (ZOLOFT) 50 MG tablet TAKE 1 TABLET BY MOUTH EVERY DAY 30 tablet 5  . metroNIDAZOLE (FLAGYL) 500 MG tablet Take 1 tablet (500 mg total) by mouth 3 (three) times daily. (Patient not taking: Reported on 03/05/2020) 21 tablet 0  . ondansetron (ZOFRAN) 4 MG tablet Take 1 tablet (4 mg total) by mouth every 6 (six) hours. (Patient not taking: Reported on 03/05/2020) 25 tablet 0   No facility-administered medications prior to visit.    No Known Allergies  ROS Review of Systems  Constitutional: Negative for appetite change, chills, fatigue and fever.  HENT: Negative for congestion, dental problem, ear pain and sore throat.   Eyes: Negative for discharge, redness and visual disturbance.  Respiratory: Negative for cough, chest tightness, shortness of breath and wheezing.   Cardiovascular: Negative for chest pain, palpitations and leg swelling.  Gastrointestinal: Negative for abdominal pain, blood in stool, diarrhea, nausea and vomiting.  Genitourinary: Negative for difficulty urinating, dysuria, flank pain, frequency, hematuria and urgency.  Musculoskeletal: Negative for arthralgias, back pain, joint swelling, myalgias and neck stiffness.  Skin: Negative for pallor and rash.  Neurological: Negative for dizziness, speech difficulty, weakness and headaches.  Hematological: Negative for adenopathy. Does not bruise/bleed easily.  Psychiatric/Behavioral: Negative for confusion and sleep disturbance. The patient is not nervous/anxious.     PE; Blood pressure 122/79, pulse 65, temperature 98.5 F (36.9 C), temperature source Temporal, resp. rate 16, height 5\' 5"  (1.651 m), weight 148 lb 9.6 oz (67.4 kg), SpO2 97 %. Body mass index is 24.73 kg/m.  Gen: Alert, well appearing.  Patient is oriented to person, place, time, and situation. AFFECT: pleasant, lucid thought and speech. ENT: Ears: EACs clear, normal epithelium.  TMs with good light reflex and landmarks  bilaterally.  Eyes: no injection, icteris, swelling, or exudate.  EOMI, PERRLA. Nose: no drainage or turbinate edema/swelling.  No injection or focal lesion.  Mouth: lips without lesion/swelling.  Oral mucosa pink and moist.  Dentition intact and without obvious caries or gingival swelling.  Oropharynx without erythema, exudate, or swelling.  Neck: supple/nontender.  No LAD, mass, or TM.  Carotid pulses 2+ bilaterally, without bruits. CV: RRR, no m/r/g.   LUNGS: CTA bilat, nonlabored resps, good aeration in all lung fields. ABD: soft, NT, ND, BS normal.  No hepatospenomegaly or mass.  No bruits. Fingertip sized abd wall defect at inferior portion of umbilicus, tender to palpation at the site and palpation there also elicits some very brief spreading  pain diffusely in abdomen. No tissue is protruding through the hernia defect. EXT: no clubbing, cyanosis, or edema.  Musculoskeletal: no joint swelling, erythema, warmth, or tenderness.  ROM of all joints intact. Skin - no sores or suspicious lesions or rashes or color changes   Pertinent labs:  Lab Results  Component Value Date   TSH 1.68 02/10/2019   Lab Results  Component Value Date   WBC 6.8 08/31/2019   HGB 15.9 08/31/2019   HCT 48.5 08/31/2019   MCV 91.2 08/31/2019   PLT 179 08/31/2019   Lab Results  Component Value Date   CREATININE 1.09 08/31/2019   BUN 10 08/31/2019   NA 137 08/31/2019   K 4.2 08/31/2019   CL 98 08/31/2019   CO2 28 08/31/2019   Lab Results  Component Value Date   ALT 22 08/31/2019   AST 22 08/31/2019   ALKPHOS 46 08/31/2019   BILITOT 1.1 08/31/2019   Lab Results  Component Value Date   CHOL 250 (H) 02/10/2019   Lab Results  Component Value Date   HDL 46.70 02/10/2019   Lab Results  Component Value Date   LDLCALC 176 (H) 02/10/2019   Lab Results  Component Value Date   TRIG 133.0 02/10/2019   Lab Results  Component Value Date   CHOLHDL 5 02/10/2019    ASSESSMENT AND PLAN:   1)  Umbilical hernia: small but symptomatic. Will refer to gen surg to get further eval. Signs/symptoms to call or return or seek emergency care for were reviewed and pt expressed understanding.  2) External hemorrhoid: BRBPR has stopped, still stings a little. He asks for some cream--->I rx'd anusol hc 2.5% cream to apply bid prn.  3) Health maintenance exam: Reviewed age and gender appropriate health maintenance issues (prudent diet, regular exercise, health risks of tobacco and excessive alcohol, use of seatbelts, fire alarms in home, use of sunscreen).  Also reviewed age and gender appropriate health screening as well as vaccine recommendations. Vaccines: Tdap due->given today. Labs: fasting HP labs ordered (HLD)--RETURN WHEN FASTING. Prostate ca screening: average risk patient= as per latest guidelines, start screening at 54 yrs of age. Colon ca screening: pt states his grandmother died of colon cancer.  Will refer for initial screening colonoscopy now.  An After Visit Summary was printed and given to the patient.  FOLLOW UP:  Return in about 1 year (around 03/05/2021) for routine chronic illness f/u.  Signed:  Crissie Sickles, MD           03/05/2020

## 2020-03-09 ENCOUNTER — Other Ambulatory Visit: Payer: Self-pay

## 2020-03-09 ENCOUNTER — Ambulatory Visit (INDEPENDENT_AMBULATORY_CARE_PROVIDER_SITE_OTHER): Payer: 59 | Admitting: Family Medicine

## 2020-03-09 DIAGNOSIS — E785 Hyperlipidemia, unspecified: Secondary | ICD-10-CM | POA: Diagnosis not present

## 2020-03-10 ENCOUNTER — Encounter: Payer: Self-pay | Admitting: Family Medicine

## 2020-03-10 LAB — CBC WITH DIFFERENTIAL/PLATELET
Absolute Monocytes: 534 cells/uL (ref 200–950)
Basophils Absolute: 28 cells/uL (ref 0–200)
Basophils Relative: 0.5 %
Eosinophils Absolute: 264 cells/uL (ref 15–500)
Eosinophils Relative: 4.8 %
HCT: 48.5 % (ref 38.5–50.0)
Hemoglobin: 16 g/dL (ref 13.2–17.1)
Lymphs Abs: 1386 cells/uL (ref 850–3900)
MCH: 30.4 pg (ref 27.0–33.0)
MCHC: 33 g/dL (ref 32.0–36.0)
MCV: 92.2 fL (ref 80.0–100.0)
MPV: 10.2 fL (ref 7.5–12.5)
Monocytes Relative: 9.7 %
Neutro Abs: 3289 cells/uL (ref 1500–7800)
Neutrophils Relative %: 59.8 %
Platelets: 221 10*3/uL (ref 140–400)
RBC: 5.26 10*6/uL (ref 4.20–5.80)
RDW: 12.5 % (ref 11.0–15.0)
Total Lymphocyte: 25.2 %
WBC: 5.5 10*3/uL (ref 3.8–10.8)

## 2020-03-10 LAB — COMPREHENSIVE METABOLIC PANEL
AG Ratio: 1.8 (calc) (ref 1.0–2.5)
ALT: 17 U/L (ref 9–46)
AST: 21 U/L (ref 10–40)
Albumin: 4.4 g/dL (ref 3.6–5.1)
Alkaline phosphatase (APISO): 37 U/L (ref 36–130)
BUN: 12 mg/dL (ref 7–25)
CO2: 27 mmol/L (ref 20–32)
Calcium: 9.6 mg/dL (ref 8.6–10.3)
Chloride: 104 mmol/L (ref 98–110)
Creat: 0.92 mg/dL (ref 0.60–1.35)
Globulin: 2.4 g/dL (calc) (ref 1.9–3.7)
Glucose, Bld: 94 mg/dL (ref 65–99)
Potassium: 4.5 mmol/L (ref 3.5–5.3)
Sodium: 140 mmol/L (ref 135–146)
Total Bilirubin: 1.1 mg/dL (ref 0.2–1.2)
Total Protein: 6.8 g/dL (ref 6.1–8.1)

## 2020-03-10 LAB — LIPID PANEL
Cholesterol: 232 mg/dL — ABNORMAL HIGH (ref <200)
HDL: 40 mg/dL (ref >=40)
LDL Cholesterol (Calc): 162 mg/dL (calc) — ABNORMAL HIGH
Non-HDL Cholesterol (Calc): 192 mg/dL (calc) — ABNORMAL HIGH (ref <130)
Total CHOL/HDL Ratio: 5.8 (calc) — ABNORMAL HIGH (ref <5.0)
Triglycerides: 156 mg/dL — ABNORMAL HIGH (ref <150)

## 2020-03-10 LAB — TSH: TSH: 1.98 mIU/L (ref 0.40–4.50)

## 2020-03-31 ENCOUNTER — Other Ambulatory Visit: Payer: Self-pay | Admitting: Family Medicine

## 2020-06-23 ENCOUNTER — Other Ambulatory Visit: Payer: Self-pay

## 2020-06-23 ENCOUNTER — Ambulatory Visit (INDEPENDENT_AMBULATORY_CARE_PROVIDER_SITE_OTHER): Payer: No Typology Code available for payment source | Admitting: Family Medicine

## 2020-06-23 ENCOUNTER — Encounter: Payer: Self-pay | Admitting: Family Medicine

## 2020-06-23 ENCOUNTER — Ambulatory Visit (INDEPENDENT_AMBULATORY_CARE_PROVIDER_SITE_OTHER): Payer: No Typology Code available for payment source

## 2020-06-23 VITALS — BP 116/80 | HR 65 | Ht 65.0 in | Wt 145.0 lb

## 2020-06-23 DIAGNOSIS — G8929 Other chronic pain: Secondary | ICD-10-CM | POA: Diagnosis not present

## 2020-06-23 DIAGNOSIS — M545 Low back pain, unspecified: Secondary | ICD-10-CM

## 2020-06-23 DIAGNOSIS — M5416 Radiculopathy, lumbar region: Secondary | ICD-10-CM

## 2020-06-23 MED ORDER — PREDNISONE 50 MG PO TABS: ORAL_TABLET | ORAL | 0 refills | Status: DC

## 2020-06-23 MED ORDER — KETOROLAC TROMETHAMINE 60 MG/2ML IM SOLN
60.0000 mg | Freq: Once | INTRAMUSCULAR | Status: AC
  Administered 2020-06-23: 60 mg via INTRAMUSCULAR

## 2020-06-23 MED ORDER — METHYLPREDNISOLONE ACETATE 80 MG/ML IJ SUSP
80.0000 mg | Freq: Once | INTRAMUSCULAR | Status: AC
  Administered 2020-06-23: 80 mg via INTRAMUSCULAR

## 2020-06-23 MED ORDER — GABAPENTIN 100 MG PO CAPS: 200.0000 mg | ORAL_CAPSULE | Freq: Every day | ORAL | 3 refills | Status: DC

## 2020-06-23 NOTE — Patient Instructions (Addendum)
Good to see you Prednisone for 5 days START TOMORROW Gabapentin 200 mg at night Xray of the back today See me again in 2 weeks ok to double book hopefully we will start manipulation

## 2020-06-23 NOTE — Assessment & Plan Note (Signed)
Patient does have a positive straight leg test.  Concern for patient having radicular symptoms.  No significant weakness of the lower extremities which is making optimistic.  Prednisone and gabapentin given today.  Warned of potential side effects.  Discussed icing regimen and home exercises.  Patient will follow up again in 2 weeks and will consider the possibility of starting osteopathic manipulation if necessary x-rays pending

## 2020-06-23 NOTE — Progress Notes (Signed)
[facility] [address] [place] Phone: [phone] Subjective:   Dennis [name] am serving as a Education administrator for [name].  This visit occurred during the SARS-CoV-2 public health emergency.  Safety protocols were in place, including screening questions prior to the visit, additional usage of staff PPE, and extensive cleaning of exam room while observing appropriate contact time as indicated for disinfecting solutions.   Dennis'm seeing this patient by the request  of:  [name], [name], MD  CC: Low back pain  [id number]  Dennis [name] is a 44 y.o. male coming in with complaint of low back pain. Patient states his back locks up. Went to [employer] for a week and believes that is where he injured his back. Most movements (extension) make it painful. Sharp pain. Patient has taken Ibuprofen and tried cobra pose for stretching. 6/10 at its worse. Pain is consistent all day.  Never truly had any back pain previously     Past Medical History:  Diagnosis Date  . Acute colitis 08/2019   CT.  Presumed infectious.  . Asthma    childhood only.  Marland Kitchen GAD (generalized anxiety disorder)   . Hypercholesterolemia 01/2019   Mild: 2020->10 yr framingham risk= 2.5%.  02/2020 Frm risk= 3%.-->TLC  . OCD (obsessive compulsive disorder)    tendencies.  ADD sx's as well.  . Seasonal allergies   . Vitamin D deficiency    Past Surgical History:  Procedure Laterality Date  . CARDIOVASCULAR STRESS TEST  12/2017   ETT--NORMAL  . VASECTOMY    . vasectomy reverasal     Social History   Socioeconomic History  . Marital status: Married    Spouse name: Not on file  . Number of children: Not on file  . Years of education: Not on file  . Highest education level: Not on file  Occupational History  . Not on file  Tobacco Use  . Smoking status: Never Smoker  . Smokeless tobacco: Never Used  Vaping Use  . Vaping Use: Never used  Substance and Sexual  Activity  . Alcohol use: No  . Drug use: No  . Sexual activity: Not on file  Other Topics Concern  . Not on file  Social History Narrative   Married, 3 daughters and 1 son.  Pt w/out siblings.   Relocated to [place] from [place] 2018.   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   No T/A/Ds.   Social Determinants of Health   Financial Resource Strain:   . Difficulty of Paying Living Expenses:   Food Insecurity:   . Worried About Charity fundraiser in the Last Year:   . Arboriculturist in the Last Year:   Transportation Needs:   . Film/video editor (Medical):   Marland Kitchen Lack of Transportation (Non-Medical):   Physical Activity:   . Days of Exercise per Week:   . Minutes of Exercise per Session:   Stress:   . Feeling of Stress :   Social Connections:   . Frequency of Communication with Friends and Family:   . Frequency of Social Gatherings with Friends and Family:   . Attends Religious Services:   . Active Member of Clubs or Organizations:   . Attends Archivist Meetings:   Marland Kitchen Marital Status:    No Known Allergies Family History  Problem Relation Age of Onset  . Cancer Mother   . Depression Mother   .  Stroke Mother   . Stroke Father   . Hypertension Father   . Early death Father   . Arthritis Maternal Grandmother   . Cancer Maternal Grandmother   . Early death Maternal Grandmother   . Alcohol abuse Maternal Grandfather   . Heart disease Maternal Grandfather   . Stroke Maternal Grandfather   . Cancer Paternal Grandfather   . COPD Paternal Grandfather     Current Outpatient Medications (Endocrine & Metabolic):  .  predniSONE (DELTASONE) 50 MG tablet, 1 tablet by mouth daily      Current Outpatient Medications (Other):  .  sertraline (ZOLOFT) 50 MG tablet, TAKE 1 TABLET BY MOUTH EVERY DAY .  gabapentin (NEURONTIN) 100 MG capsule, Take 2 capsules (200 mg total) by mouth at bedtime.   Reviewed prior external  information including notes and imaging from  primary care provider As well as notes that were available from care everywhere and other healthcare systems.  Past medical history, social, surgical and family history all reviewed in electronic medical record.  No pertanent information unless stated regarding to the chief complaint.   Review of Systems:  No headache, visual changes, nausea, vomiting, diarrhea, constipation, dizziness, abdominal pain, skin rash, fevers, chills, night sweats, weight loss, swollen lymph nodes, body aches, joint swelling, chest pain, shortness of breath, mood changes. POSITIVE muscle aches  Objective  Blood pressure 116/80, pulse 65, height 5\' 5"  (1.651 m), weight 145 lb (65.8 kg), SpO2 97 %.   General: No apparent distress alert and oriented x3 mood and affect normal, dressed appropriately.  HEENT: Pupils equal, extraocular movements intact  Respiratory: Patient's speak in full sentences and does not appear short of breath  Cardiovascular: No lower extremity edema, non tender, no erythema  Neuro: Cranial nerves II through XII are intact, neurovascularly intact in all extremities with 2+ DTRs and 2+ pulses.  Gait normal with good balance and coordination.  MSK:  Non tender with full range of motion and good stability and symmetric strength and tone of shoulders, elbows, wrist, hip, knee and ankles bilaterally.  Back exam shows the patient has been tender to palpation in the paraspinal musculature on the right side in the L4-L5 area.  Patient does have a positive straight leg test in the L5 distribution at 25 degrees of forward flexion.  Patient does complain of mild cramping of the foot.  Does appear to be neurovascularly intact.   Impression and Recommendations:     The above documentation has been reviewed and is accurate and complete Lyndal Pulley, DO       Note: This dictation was prepared with Dragon dictation along with smaller phrase technology. Any  transcriptional errors that result from this process are unintentional.

## 2020-07-07 ENCOUNTER — Ambulatory Visit: Payer: No Typology Code available for payment source | Admitting: Family Medicine

## 2020-07-07 NOTE — Progress Notes (Deleted)
[name] [place] Phone: [phone] Subjective:    I'm seeing this patient by the request  of:  [name], [name], MD  CC:   [id number]   06/23/2020 Patient does have a positive straight leg test.  Concern for patient having radicular symptoms.  No significant weakness of the lower extremities which is making optimistic.  Prednisone and gabapentin given today.  Warned of potential side effects.  Discussed icing regimen and home exercises.  Patient will follow up again in 2 weeks and will consider the possibility of starting osteopathic manipulation if necessary x-rays pending  Update 07/07/2020 Dennis [name] is a 44 y.o. male coming in with complaint of low back pain. Patient states        Past Medical History:  Diagnosis Date  . Acute colitis 08/2019   CT.  Presumed infectious.  . Asthma    childhood only.  Marland Kitchen GAD (generalized anxiety disorder)   . Hypercholesterolemia 01/2019   Mild: 2020->10 yr framingham risk= 2.5%.  02/2020 Frm risk= 3%.-->TLC  . OCD (obsessive compulsive disorder)    tendencies.  ADD sx's as well.  . Seasonal allergies   . Vitamin D deficiency    Past Surgical History:  Procedure Laterality Date  . CARDIOVASCULAR STRESS TEST  12/2017   ETT--NORMAL  . VASECTOMY    . vasectomy reverasal     Social History   Socioeconomic History  . Marital status: Married    Spouse name: Not on file  . Number of children: Not on file  . Years of education: Not on file  . Highest education level: Not on file  Occupational History  . Not on file  Tobacco Use  . Smoking status: Never Smoker  . Smokeless tobacco: Never Used  Vaping Use  . Vaping Use: Never used  Substance and Sexual Activity  . Alcohol use: No  . Drug use: No  . Sexual activity: Not on file  Other Topics Concern  . Not on file  Social History Narrative   Married, 3 daughters and 1 son.  Pt w/out siblings.   Relocated to  [place] from [place] 2018.   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   No T/A/Ds.   Social Determinants of Health   Financial Resource Strain:   . Difficulty of Paying Living Expenses:   Food Insecurity:   . Worried About Charity fundraiser in the Last Year:   . Arboriculturist in the Last Year:   Transportation Needs:   . Film/video editor (Medical):   Marland Kitchen Lack of Transportation (Non-Medical):   Physical Activity:   . Days of Exercise per Week:   . Minutes of Exercise per Session:   Stress:   . Feeling of Stress :   Social Connections:   . Frequency of Communication with Friends and Family:   . Frequency of Social Gatherings with Friends and Family:   . Attends Religious Services:   . Active Member of Clubs or Organizations:   . Attends Archivist Meetings:   Marland Kitchen Marital Status:    No Known Allergies Family History  Problem Relation Age of Onset  . Cancer Mother   . Depression Mother   . Stroke Mother   . Stroke Father   . Hypertension Father   . Early death Father   . Arthritis Maternal Grandmother   . Cancer Maternal Grandmother   . Early death  Maternal Grandmother   . Alcohol abuse Maternal Grandfather   . Heart disease Maternal Grandfather   . Stroke Maternal Grandfather   . Cancer Paternal Grandfather   . COPD Paternal Grandfather     Current Outpatient Medications (Endocrine & Metabolic):  .  predniSONE (DELTASONE) 50 MG tablet, 1 tablet by mouth daily      Current Outpatient Medications (Other):  .  gabapentin (NEURONTIN) 100 MG capsule, Take 2 capsules (200 mg total) by mouth at bedtime. .  sertraline (ZOLOFT) 50 MG tablet, TAKE 1 TABLET BY MOUTH EVERY DAY   Reviewed prior external information including notes and imaging from  primary care provider As well as notes that were available from care everywhere and other healthcare systems.  Past medical history, social, surgical and family history all  reviewed in electronic medical record.  No pertanent information unless stated regarding to the chief complaint.   Review of Systems:  No headache, visual changes, nausea, vomiting, diarrhea, constipation, dizziness, abdominal pain, skin rash, fevers, chills, night sweats, weight loss, swollen lymph nodes, body aches, joint swelling, chest pain, shortness of breath, mood changes. POSITIVE muscle aches  Objective  There were no vitals taken for this visit.   General: No apparent distress alert and oriented x3 mood and affect normal, dressed appropriately.  HEENT: Pupils equal, extraocular movements intact  Respiratory: Patient's speak in full sentences and does not appear short of breath  Cardiovascular: No lower extremity edema, non tender, no erythema  Neuro: Cranial nerves II through XII are intact, neurovascularly intact in all extremities with 2+ DTRs and 2+ pulses.  Gait normal with good balance and coordination.  MSK:  Non tender with full range of motion and good stability and symmetric strength and tone of shoulders, elbows, wrist, hip, knee and ankles bilaterally.     Impression and Recommendations:     The above documentation has been reviewed and is accurate and complete Jacqualin Combes       Note: This dictation was prepared with Dragon dictation along with smaller phrase technology. Any transcriptional errors that result from this process are unintentional.

## 2020-07-13 ENCOUNTER — Ambulatory Visit (INDEPENDENT_AMBULATORY_CARE_PROVIDER_SITE_OTHER): Payer: No Typology Code available for payment source | Admitting: Family Medicine

## 2020-07-13 ENCOUNTER — Other Ambulatory Visit: Payer: Self-pay

## 2020-07-13 ENCOUNTER — Encounter: Payer: Self-pay | Admitting: Family Medicine

## 2020-07-13 VITALS — BP 120/84 | HR 76 | Ht 65.0 in | Wt 148.0 lb

## 2020-07-13 DIAGNOSIS — M999 Biomechanical lesion, unspecified: Secondary | ICD-10-CM

## 2020-07-13 DIAGNOSIS — M545 Low back pain, unspecified: Secondary | ICD-10-CM

## 2020-07-13 DIAGNOSIS — G8929 Other chronic pain: Secondary | ICD-10-CM

## 2020-07-13 DIAGNOSIS — M5416 Radiculopathy, lumbar region: Secondary | ICD-10-CM | POA: Diagnosis not present

## 2020-07-13 MED ORDER — MELOXICAM 15 MG PO TABS: 15.0000 mg | ORAL_TABLET | Freq: Every day | ORAL | 2 refills | Status: DC

## 2020-07-13 MED ORDER — TIZANIDINE HCL 4 MG PO TABS: 4.0000 mg | ORAL_TABLET | Freq: Every day | ORAL | 1 refills | Status: DC

## 2020-07-13 NOTE — Progress Notes (Signed)
[name] Phone: [phone] Subjective:   Dennis [name], am serving as a scribe for [name].  This visit occurred during the SARS-CoV-2 public health emergency.  Safety protocols were in place, including screening questions prior to the visit, additional usage of staff PPE, and extensive cleaning of exam room while observing appropriate contact time as indicated for disinfecting solutions.    I'm seeing this patient by the request  of:  Dennis [name], Dennis Blackwater, MD  CC: follow up back pain   [id number]  06/23/2020 Patient does have a positive straight leg test.  Concern for patient having radicular symptoms.  No significant weakness of the lower extremities which is making optimistic.  Prednisone and gabapentin given today.  Warned of potential side effects.  Discussed icing regimen and home exercises.  Patient will follow up again in 2 weeks and will consider the possibility of starting osteopathic manipulation if necessary x-rays pending  Update 06/25/2020 Since last visit patient states stopped gabapentin because it was giving him side effects of fear, anxiety, and sadness. States feels the prednisone did help but the back pain is not going away. Feels stiff today and has continued to swim but is frustrated with the pain.  Patient states that it does not stop him from anything that is usual.  Not significantly waking him up at night.    Past Medical History:  Diagnosis Date  . Acute colitis 08/2019   CT.  Presumed infectious.  . Asthma    childhood only.  Marland Kitchen GAD (generalized anxiety disorder)   . Hypercholesterolemia 01/2019   Mild: 2020->10 yr framingham risk= 2.5%.  02/2020 Frm risk= 3%.-->TLC  . OCD (obsessive compulsive disorder)    tendencies.  ADD sx's as well.  . Seasonal allergies   . Vitamin D deficiency    Past Surgical History:  Procedure Laterality Date  . CARDIOVASCULAR STRESS TEST   12/2017   ETT--NORMAL  . VASECTOMY    . vasectomy reverasal     Social History   Socioeconomic History  . Marital status: Married    Spouse name: Not on file  . Number of children: Not on file  . Years of education: Not on file  . Highest education level: Not on file  Occupational History  . Not on file  Tobacco Use  . Smoking status: Never Smoker  . Smokeless tobacco: Never Used  Vaping Use  . Vaping Use: Never used  Substance and Sexual Activity  . Alcohol use: No  . Drug use: No  . Sexual activity: Not on file  Other Topics Concern  . Not on file  Social History Narrative   Married, 3 daughters and 1 son.  Pt w/out siblings.   Relocated to [place] from [place] 2018.   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   No T/A/Ds.   Social Determinants of Health   Financial Resource Strain:   . Difficulty of Paying Living Expenses:   Food Insecurity:   . Worried About Charity fundraiser in the Last Year:   . Arboriculturist in the Last Year:   Transportation Needs:   . Film/video editor (Medical):   Marland Kitchen Lack of Transportation (Non-Medical):   Physical Activity:   . Days of Exercise per Week:   . Minutes of Exercise per Session:   Stress:   . Feeling of Stress :   Social Connections:   .  Frequency of Communication with Friends and Family:   . Frequency of Social Gatherings with Friends and Family:   . Attends Religious Services:   . Active Member of Clubs or Organizations:   . Attends Archivist Meetings:   Marland Kitchen Marital Status:    No Known Allergies Family History  Problem Relation Age of Onset  . Cancer Mother   . Depression Mother   . Stroke Mother   . Stroke Father   . Hypertension Father   . Early death Father   . Arthritis Maternal Grandmother   . Cancer Maternal Grandmother   . Early death Maternal Grandmother   . Alcohol abuse Maternal Grandfather   . Heart disease Maternal Grandfather   . Stroke  Maternal Grandfather   . Cancer Paternal Grandfather   . COPD Paternal Grandfather     Current Outpatient Medications (Endocrine & Metabolic):  .  predniSONE (DELTASONE) 50 MG tablet, 1 tablet by mouth daily (Patient not taking: Reported on 07/13/2020)    Current Outpatient Medications (Analgesics):  .  meloxicam (MOBIC) 15 MG tablet, Take 1 tablet (15 mg total) by mouth daily.   Current Outpatient Medications (Other):  .  sertraline (ZOLOFT) 50 MG tablet, TAKE 1 TABLET BY MOUTH EVERY DAY .  gabapentin (NEURONTIN) 100 MG capsule, Take 2 capsules (200 mg total) by mouth at bedtime. (Patient not taking: Reported on 07/13/2020) .  tiZANidine (ZANAFLEX) 4 MG tablet, Take 1 tablet (4 mg total) by mouth at bedtime.   Reviewed prior external information including notes and imaging from  primary care provider As well as notes that were available from care everywhere and other healthcare systems.  Past medical history, social, surgical and family history all reviewed in electronic medical record.  No pertanent information unless stated regarding to the chief complaint.   Review of Systems:  No headache, visual changes, nausea, vomiting, diarrhea, constipation, dizziness, abdominal pain, skin rash, fevers, chills, night sweats, weight loss, swollen lymph nodes, body aches, joint swelling, chest pain, shortness of breath, mood changes. POSITIVE muscle aches  Objective  Blood pressure 120/84, pulse 76, height 5\' 5"  (1.651 m), weight 148 lb (67.1 kg), SpO2 98 %.   General: No apparent distress alert and oriented x3 mood and affect normal, dressed appropriately.  HEENT: Pupils equal, extraocular movements intact  Respiratory: Patient's speak in full sentences and does not appear short of breath  Cardiovascular: No lower extremity edema, non tender, no erythema  Neuro: Cranial nerves II through XII are intact, neurovascularly intact in all extremities with 2+ DTRs and 2+ pulses.  Gait normal with  good balance and coordination.  MSK:  Non tender with full range of motion and good stability and symmetric strength and tone of shoulders, elbows, wrist, hip, knee and ankles bilaterally.  Low back exam does show some tightness noted in the paraspinal musculature from the thoracolumbar all the way to the sacral area.  Patient does have some tightness of the hip flexors.  Osteopathic findings  T11 extended rotated and side bent left L2 flexed rotated and side bent right Sacrum right on right    Impression and Recommendations:     The above documentation has been reviewed and is accurate and complete Lyndal Pulley, DO       Note: This dictation was prepared with Dragon dictation along with smaller phrase technology. Any transcriptional errors that result from this process are unintentional.

## 2020-07-13 NOTE — Patient Instructions (Addendum)
Good to see you.  Meloxicam 15mg  daily  zanaflex 4mg  at night as needed Spenco orthotics total support  PT [facility] Tried manipulation today See me again in 4-6 weeks

## 2020-07-13 NOTE — Assessment & Plan Note (Signed)

## 2020-07-13 NOTE — Assessment & Plan Note (Signed)
No longer any significant radicular symptoms but did start on gabapentin and encouraged him to potentially try.  Patient has muscle relaxers for breakthrough as well.  Attempted osteopathic manipulation today.  Referred to formal physical therapy that I think will be beneficial.  Follow-up again 4 to 8 weeks

## 2020-08-06 ENCOUNTER — Other Ambulatory Visit: Payer: Self-pay | Admitting: Family Medicine

## 2020-08-18 ENCOUNTER — Ambulatory Visit: Payer: No Typology Code available for payment source | Admitting: Family Medicine

## 2020-08-18 NOTE — Progress Notes (Deleted)
  [facility] [place] Phone: [phone] Subjective:    I'm seeing this patient by the request  of:  [name], [name], MD  CC:   [id number]  Dennis [name] is a 44 y.o. male coming in with complaint of back and neck pain. OMT 07/13/2020. Patient states   Medications patient has been prescribed:   Taking:         Reviewed prior external information including notes and imaging from previsou exam, outside providers and external EMR if available.   As well as notes that were available from care everywhere and other healthcare systems.  Past medical history, social, surgical and family history all reviewed in electronic medical record.  No pertanent information unless stated regarding to the chief complaint.   Past Medical History:  Diagnosis Date  . Acute colitis 08/2019   CT.  Presumed infectious.  . Asthma    childhood only.  Marland Kitchen GAD (generalized anxiety disorder)   . Hypercholesterolemia 01/2019   Mild: 2020->10 yr framingham risk= 2.5%.  02/2020 Frm risk= 3%.-->TLC  . OCD (obsessive compulsive disorder)    tendencies.  ADD sx's as well.  . Seasonal allergies   . Vitamin D deficiency     No Known Allergies   Review of Systems:  No headache, visual changes, nausea, vomiting, diarrhea, constipation, dizziness, abdominal pain, skin rash, fevers, chills, night sweats, weight loss, swollen lymph nodes, body aches, joint swelling, chest pain, shortness of breath, mood changes. POSITIVE muscle aches  Objective  There were no vitals taken for this visit.   General: No apparent distress alert and oriented x3 mood and affect normal, dressed appropriately.  HEENT: Pupils equal, extraocular movements intact  Respiratory: Patient's speak in full sentences and does not appear short of breath  Cardiovascular: No lower extremity edema, non tender, no erythema  Neuro: Cranial nerves II through XII are intact,  neurovascularly intact in all extremities with 2+ DTRs and 2+ pulses.  Gait normal with good balance and coordination.  MSK:  Non tender with full range of motion and good stability and symmetric strength and tone of shoulders, elbows, wrist, hip, knee and ankles bilaterally.  Back - Normal skin, Spine with normal alignment and no deformity.  No tenderness to vertebral process palpation.  Paraspinous muscles are not tender and without spasm.   Range of motion is full at neck and lumbar sacral regions  Osteopathic findings  C2 flexed rotated and side bent right C6 flexed rotated and side bent left T3 extended rotated and side bent right inhaled rib T9 extended rotated and side bent left L2 flexed rotated and side bent right Sacrum right on right       Assessment and Plan:    Nonallopathic problems  Decision today to treat with OMT was based on Physical Exam  After verbal consent patient was treated with HVLA, ME, FPR techniques in cervical, rib, thoracic, lumbar, and sacral  areas  Patient tolerated the procedure well with improvement in symptoms  Patient given exercises, stretches and lifestyle modifications  See medications in patient instructions if given  Patient will follow up in 4-8 weeks      The above documentation has been reviewed and is accurate and complete Jacqualin Combes       Note: This dictation was prepared with Dragon dictation along with smaller phrase technology. Any transcriptional errors that result from this process are unintentional.

## 2020-08-23 ENCOUNTER — Other Ambulatory Visit: Payer: Self-pay | Admitting: Family Medicine

## 2020-10-04 ENCOUNTER — Other Ambulatory Visit: Payer: Self-pay | Admitting: Family Medicine

## 2020-12-29 ENCOUNTER — Telehealth: Payer: No Typology Code available for payment source | Admitting: Family Medicine

## 2021-03-14 ENCOUNTER — Other Ambulatory Visit: Payer: Self-pay | Admitting: Family Medicine

## 2021-04-07 ENCOUNTER — Other Ambulatory Visit: Payer: Self-pay | Admitting: Family Medicine

## 2021-04-11 ENCOUNTER — Other Ambulatory Visit: Payer: Self-pay | Admitting: Family Medicine

## 2021-04-15 ENCOUNTER — Telehealth: Payer: Self-pay | Admitting: Family Medicine

## 2021-04-15 MED ORDER — SERTRALINE HCL 50 MG PO TABS: 1.0000 | ORAL_TABLET | Freq: Every day | ORAL | 0 refills | Status: DC

## 2021-04-15 NOTE — Telephone Encounter (Signed)
Patient requesting refill of sertraline, states he has 6 days left. Scheduled appt at next avail 04/28/21. Please send bridge refill to same [facility] in [place].

## 2021-04-15 NOTE — Telephone Encounter (Signed)
Tried calling patient regarding rx, confirmed with the pharmacy that rx has not been picked but text was sent stating rx was ready and available.

## 2021-04-15 NOTE — Telephone Encounter (Signed)
Rx sent for 7d supply which will get him until 05/05 appt. LM for pt to return call regarding rx.

## 2021-04-18 NOTE — Telephone Encounter (Signed)
Confirmed with the pharmacy med was not picked up yet. Spoke with patient to inform rx sent on Friday until 05/05 appt

## 2021-04-28 ENCOUNTER — Telehealth: Payer: Self-pay | Admitting: Family Medicine

## 2021-04-28 ENCOUNTER — Other Ambulatory Visit: Payer: Self-pay | Admitting: Family Medicine

## 2021-04-28 ENCOUNTER — Other Ambulatory Visit: Payer: Self-pay

## 2021-04-28 ENCOUNTER — Encounter: Payer: Self-pay | Admitting: Family Medicine

## 2021-04-28 ENCOUNTER — Ambulatory Visit (INDEPENDENT_AMBULATORY_CARE_PROVIDER_SITE_OTHER): Payer: No Typology Code available for payment source | Admitting: Family Medicine

## 2021-04-28 VITALS — BP 129/83 | HR 80 | Temp 97.9°F | Resp 16 | Ht 65.0 in | Wt 142.4 lb

## 2021-04-28 DIAGNOSIS — F411 Generalized anxiety disorder: Secondary | ICD-10-CM

## 2021-04-28 DIAGNOSIS — F321 Major depressive disorder, single episode, moderate: Secondary | ICD-10-CM

## 2021-04-28 MED ORDER — NYSTATIN 100000 UNIT/ML MT SUSP: 5.0000 mL | Freq: Four times a day (QID) | OROMUCOSAL | 1 refills | Status: DC | PRN

## 2021-04-28 MED ORDER — SERTRALINE HCL 100 MG PO TABS: 100.0000 mg | ORAL_TABLET | Freq: Every day | ORAL | 3 refills | Status: DC

## 2021-04-28 MED ORDER — LORAZEPAM 0.5 MG PO TABS: ORAL_TABLET | ORAL | 1 refills | Status: DC

## 2021-04-28 NOTE — Telephone Encounter (Signed)
Pt had appt today and said that the magic mouthwash wasn't at the pharmacy, please advise

## 2021-04-28 NOTE — Addendum Note (Signed)
Addended by: Tammi Sou on: 04/28/2021 05:20 PM   Modules accepted: Orders

## 2021-04-28 NOTE — Telephone Encounter (Signed)
Provider verbally made aware to confirm if rx was supposed to be sent. Confirmed with Jenny Reichmann, pharmacist that rx received but needed further clarification. 1:1, 67mL ratio with a total of 240 mL instead of 120. Spoke with pt regarding this to apologize for the delay but the pharmacy is getting rx together currently.

## 2021-04-28 NOTE — Progress Notes (Addendum)
OFFICE VISIT  04/28/2021  CC:  Chief Complaint  Patient presents with  . Follow-up    GAD    HPI:    Patient is a 45 y.o. Caucasian male who presents for f/u GAD. A/P as of last visit: "1) Umbilical hernia: small but symptomatic. Will refer to gen surg to get further eval. Signs/symptoms to call or return or seek emergency care for were reviewed and pt expressed understanding.  2) External hemorrhoid: BRBPR has stopped, still stings a little. He asks for some cream--->I rx'd anusol hc 2.5% cream to apply bid prn.  3) Health maintenance exam: Reviewed age and gender appropriate health maintenance issues (prudent diet, regular exercise, health risks of tobacco and excessive alcohol, use of seatbelts, fire alarms in home, use of sunscreen).  Also reviewed age and gender appropriate health screening as well as vaccine recommendations. Vaccines: Tdap due->given today. Labs: fasting HP labs ordered (HLD)--RETURN WHEN FASTING. Prostate ca screening: average risk patient= as per latest guidelines, start screening at 60 yrs of age. Colon ca screening: pt states his grandmother died of colon cancer.  Will refer for initial screening colonoscopy now."  INTERIM HX:   He ended up declining to schedule colonoscopy when I referred him for this last year-->his insurer would not pay until age 26. Labs at that time showed all normal except hyperlipidemia, framingham cv risk <7.5%, TLC recommended. Referred him to gen surg last year as well, for his umbil hernia-->surgeon did not recommend surgery.  GAD/depression: has been on sertraline 50mg  long term. His wife was dx'd with glioblastoma 3 mo ago and has just finished chemo and radiation and 2 surgeries. He has 5 kids.  He has taken some time off work but returns next week part time. He is very anxious, irritable, cries easily.  Feels like he's on edge all the time.  Sleep disrupted by bad dreams plus wife coughs a lot and wakes him up.  Never  feels rested. Getting lip ulcers b/c of all the stress.  Drinks 2 cups coffee.   He and his family have lots of friends/community support. He declines counseling.  No SI or HI.  Past Medical History:  Diagnosis Date  . Acute colitis 08/2019   CT.  Presumed infectious.  . Asthma    childhood only.  Marland Kitchen GAD (generalized anxiety disorder)   . Hypercholesterolemia 01/2019   Mild: 2020->10 yr framingham risk= 2.5%.  02/2020 Frm risk= 3%.-->TLC  . OCD (obsessive compulsive disorder)    tendencies.  ADD sx's as well.  . Seasonal allergies   . Vitamin D deficiency     Past Surgical History:  Procedure Laterality Date  . CARDIOVASCULAR STRESS TEST  12/2017   ETT--NORMAL  . VASECTOMY    . vasectomy reverasal      Outpatient Medications Prior to Visit  Medication Sig Dispense Refill  . sertraline (ZOLOFT) 50 MG tablet Take 1 tablet (50 mg total) by mouth daily. 7 tablet 0  . gabapentin (NEURONTIN) 100 MG capsule Take 2 capsules (200 mg total) by mouth at bedtime. (Patient not taking: No sig reported) 180 capsule 3  . meloxicam (MOBIC) 15 MG tablet Take 1 tablet (15 mg total) by mouth daily. (Patient not taking: Reported on 04/28/2021) 30 tablet 2  . predniSONE (DELTASONE) 50 MG tablet 1 tablet by mouth daily (Patient not taking: No sig reported) 5 tablet 0  . tiZANidine (ZANAFLEX) 4 MG tablet TAKE 1 TABLET (4 MG TOTAL) BY MOUTH AT BEDTIME. (Patient not taking:  Reported on 04/28/2021) 90 tablet 1   No facility-administered medications prior to visit.    No Known Allergies  ROS As per HPI  PE: Vitals with BMI 04/28/2021 07/13/2020 06/23/2020  Height 5\' 5"  5\' 5"  5\' 5"   Weight 142 lbs 6 oz 148 lbs 145 lbs  BMI 23.7 91.50 56.97  Systolic 948 016 553  Diastolic 83 84 80  Pulse 80 76 65     Wt Readings from Last 2 Encounters:  04/28/21 142 lb 6.4 oz (64.6 kg)  07/13/20 148 lb (67.1 kg)    Gen: alert, oriented x 4, affect pleasant.  Lucid thinking and conversation noted. HEENT: PERRLA,  EOMI.   MOUTH: inner aspect of lower lip with 4 distinct shallow ulcerations Neck: no LAD, mass, or thyromegaly. CV: RRR, no m/r/g LUNGS: CTA bilat, nonlabored. NEURO: no tremor or tics noted on observation.  Coordination intact. CN 2-12 grossly intact bilaterally, strength 5/5 in all extremeties.  No ataxia.   LABS:  Lab Results  Component Value Date   TSH 1.98 03/09/2020   Lab Results  Component Value Date   WBC 5.5 03/09/2020   HGB 16.0 03/09/2020   HCT 48.5 03/09/2020   MCV 92.2 03/09/2020   PLT 221 03/09/2020   Lab Results  Component Value Date   CREATININE 0.92 03/09/2020   BUN 12 03/09/2020   NA 140 03/09/2020   K 4.5 03/09/2020   CL 104 03/09/2020   CO2 27 03/09/2020   Lab Results  Component Value Date   ALT 17 03/09/2020   AST 21 03/09/2020   ALKPHOS 46 08/31/2019   BILITOT 1.1 03/09/2020   Lab Results  Component Value Date   CHOL 232 (H) 03/09/2020   Lab Results  Component Value Date   HDL 40 03/09/2020   Lab Results  Component Value Date   LDLCALC 162 (H) 03/09/2020   Lab Results  Component Value Date   TRIG 156 (H) 03/09/2020   Lab Results  Component Value Date   CHOLHDL 5.8 (H) 03/09/2020   IMPRESSION AND PLAN:  GAD with MDD, mild/mod episode. Inc sertraline to 100 mg qd. Start trial of lorazepam 0.5mg , 1-2 bid prn, #30, RF x 1. Therapeutic expectations and side effect profile of medications discussed today.  Patient's questions answered. Aphthous ulcers: Magic mouthwash 1 tsp swish and spit qid prn.  Spent 30 min with pt today reviewing HPI, reviewing relevant past history, doing exam, reviewing and discussing lab and imaging data, and formulating plans.  An After Visit Summary was printed and given to the patient.  FOLLOW UP: Return in about 4 weeks (around 05/26/2021) for f/u anx/dep.  Signed:  Crissie Sickles, MD           04/28/2021

## 2021-05-02 NOTE — Telephone Encounter (Signed)
Spoke with pharmacist to confirm magic mouthwash rc received that was called in on 5/5 with 1 refill and is available for pick up. See phone note from 5/5 with details. Nothing further needed regarding this rx.

## 2021-05-02 NOTE — Telephone Encounter (Signed)
Pharmacy needs ratio (qty) for each ingredient.  Nystatin, diphenhydramine, alum & mag hydroxide.

## 2021-05-20 ENCOUNTER — Other Ambulatory Visit: Payer: Self-pay | Admitting: Family Medicine

## 2021-06-28 ENCOUNTER — Other Ambulatory Visit: Payer: Self-pay

## 2021-06-28 NOTE — Telephone Encounter (Signed)
It would be illegal for me to refill controlled substance on a patient that I have not seen. It would also violate our [place] protocol for controlled substances, in which patients should only be getting their controlled substance from their PCP.

## 2021-06-28 NOTE — Telephone Encounter (Signed)
Requesting: lorazepam Contract:n/a UDS:n/a Last Visit:04/28/21 Next Visit:07/28/21 Last Refill:30,0  Please Advise, sending request to PCP and in office provider in absence of PCP

## 2021-06-29 MED ORDER — LORAZEPAM 0.5 MG PO TABS: ORAL_TABLET | ORAL | 1 refills | Status: DC

## 2021-06-29 NOTE — Telephone Encounter (Signed)
Spoke with patient regarding results/recommendations.  

## 2021-06-29 NOTE — Telephone Encounter (Signed)
I sent in RF as per pt request.

## 2021-06-29 NOTE — Addendum Note (Signed)
Addended by: Tammi Sou on: 06/29/2021 12:37 PM   Modules accepted: Orders

## 2021-06-29 NOTE — Telephone Encounter (Signed)
LM for pt to return call regarding refill.

## 2021-07-28 ENCOUNTER — Encounter: Payer: Self-pay | Admitting: Family Medicine

## 2021-07-28 ENCOUNTER — Ambulatory Visit (INDEPENDENT_AMBULATORY_CARE_PROVIDER_SITE_OTHER): Payer: No Typology Code available for payment source | Admitting: Family Medicine

## 2021-07-28 ENCOUNTER — Other Ambulatory Visit: Payer: Self-pay

## 2021-07-28 VITALS — BP 124/77 | HR 71 | Temp 97.6°F | Ht 65.0 in | Wt 142.2 lb

## 2021-07-28 DIAGNOSIS — F4323 Adjustment disorder with mixed anxiety and depressed mood: Secondary | ICD-10-CM | POA: Diagnosis not present

## 2021-07-28 DIAGNOSIS — F321 Major depressive disorder, single episode, moderate: Secondary | ICD-10-CM

## 2021-07-28 MED ORDER — LORAZEPAM 0.5 MG PO TABS: ORAL_TABLET | ORAL | 1 refills | Status: DC

## 2021-07-28 MED ORDER — SERTRALINE HCL 50 MG PO TABS: 50.0000 mg | ORAL_TABLET | Freq: Every day | ORAL | 3 refills | Status: DC

## 2021-07-28 MED ORDER — SERTRALINE HCL 100 MG PO TABS: 100.0000 mg | ORAL_TABLET | Freq: Every day | ORAL | 1 refills | Status: DC

## 2021-07-28 NOTE — Progress Notes (Signed)
OFFICE VISIT  07/28/2021  CC: f/u anx/dep  HPI:    Patient is a 45 y.o. Caucasian male who presents for 3 mo f/u GAD and MDD. A/P as of last visit: "GAD with MDD, mild/mod episode. Inc sertraline to 100 mg qd. Start trial of lorazepam 0.'5mg'$ , 1-2 bid prn, #30, RF x 1. Therapeutic expectations and side effect profile of medications discussed today.  Patient's questions answered. Aphthous ulcers: Magic mouthwash 1 tsp swish and spit qid prn."  INTERIM HX: Doing a bit better, gets some anti-anxiety help and insomnia help from lorazepam, takes in 1-2 times a day. Grinding through his poor mood and anxiety daily.  Wife's brain cancer has everyone still in limbo, treatment and surveillance causes ongoing worry and adjustment for everyone--he has 5 kids and is working full time again.  Not as much catastrophic thinking.  Feeling tensed up and unsettled all the time. No SI or HI. No side effects from sertraline.    PMP AWARE reviewed today: most recent rx for lorazepam 0.'5mg'$   was filled 06/29/21, # 30, rx by me. No red flags.   Past Medical History:  Diagnosis Date   Acute colitis 08/2019   CT.  Presumed infectious.   Asthma    childhood only.   GAD (generalized anxiety disorder)    Hypercholesterolemia 01/2019   Mild: 2020->10 yr framingham risk= 2.5%.  02/2020 Frm risk= 3%.-->TLC   OCD (obsessive compulsive disorder)    tendencies.  ADD sx's as well.   Seasonal allergies    Vitamin D deficiency     Past Surgical History:  Procedure Laterality Date   CARDIOVASCULAR STRESS TEST  12/2017   ETT--NORMAL   VASECTOMY     vasectomy reverasal     Social History   Socioeconomic History   Marital status: Married    Spouse name: Not on file   Number of children: Not on file   Years of education: Not on file   Highest education level: Not on file  Occupational History   Not on file  Tobacco Use   Smoking status: Never   Smokeless tobacco: Never  Vaping Use   Vaping Use: Never  used  Substance and Sexual Activity   Alcohol use: No   Drug use: No   Sexual activity: Not on file  Other Topics Concern   Not on file  Social History Narrative   Married, 3 daughters and 1 son.  Pt w/out siblings.   Relocated to [place] from [place] 2018.   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   No T/A/Ds.   Social Determinants of Health   Financial Resource Strain: Not on file  Food Insecurity: Not on file  Transportation Needs: Not on file  Physical Activity: Not on file  Stress: Not on file  Social Connections: Not on file    Outpatient Medications Prior to Visit  Medication Sig Dispense Refill   nystatin (MYCOSTATIN) 100000 UNIT/ML suspension SWISH AND SPIT 5 MLS 4 (FOUR) TIMES DAILY AS NEEDED FOR MOUTH PAIN. (Patient not taking: Reported on 07/28/2021) 120 mL 1   LORazepam (ATIVAN) 0.5 MG tablet 1-2 tabs po bid prn anxiety 30 tablet 1   sertraline (ZOLOFT) 100 MG tablet TAKE 1 TABLET BY MOUTH EVERY DAY 90 tablet 0   No facility-administered medications prior to visit.    No Known Allergies  ROS As per HPI  PE: Vitals with BMI 07/28/2021 04/28/2021 07/13/2020  Height '5\' 5"'$  '5\' 5"'$  '5\' 5"'$   Weight  142 lbs 3 oz 142 lbs 6 oz 148 lbs  BMI 23.66 0000000 [id number]  Systolic A999333 [id number]  Diastolic 77 83 84  Pulse 71 80 76   Gen: Alert, well appearing.  Patient is oriented to person, place, time, and situation. AFFECT: pleasant, lucid thought and speech. No further exam today.  LABS:    Chemistry      Component Value Date/Time   NA 140 03/09/2020 0906   K 4.5 03/09/2020 0906   CL 104 03/09/2020 0906   CO2 27 03/09/2020 0906   BUN 12 03/09/2020 0906   CREATININE 0.92 03/09/2020 0906      Component Value Date/Time   CALCIUM 9.6 03/09/2020 0906   ALKPHOS 46 08/31/2019 0650   AST 21 03/09/2020 0906   ALT 17 03/09/2020 0906   BILITOT 1.1 03/09/2020 0906     Lab Results  Component Value Date   TSH 1.98 03/09/2020    IMPRESSION  AND PLAN:  MDD, moderate.  Adjustment d/o with mixed anx/dep. He is a little improved from a moods standpoint, perhaps a bit more improved from an anxiety standpoint. He doesn't drink alcohol or use any drugs. I'll continue lorazepam 0.'5mg'$ , 1 bid prn, #60, RF x 1. We discussed potential addictive/dependence properties of this med. He expressed understanding and wants to remain on it. We'll increase his sertraline to '150mg'$  qd.  An After Visit Summary was printed and given to the patient.  FOLLOW UP: Return in about 2 months (around 09/27/2021) for f/u anx/dep/med.  Signed:  Crissie Sickles, MD           07/28/2021

## 2021-08-01 ENCOUNTER — Telehealth: Payer: Self-pay

## 2021-08-01 NOTE — Telephone Encounter (Signed)
LM for pt to return call regarding rx. Please inform pt he has prescription on file with 1 refill sent on 08/04

## 2021-08-01 NOTE — Telephone Encounter (Signed)
Patient was seen last week.  He states he has only received 1 of 2 of his prescriptions.  Please call patient [phone]. The prescription that he is missing is LORazepam (ATIVAN) 0.5 MG tablet [id number]

## 2021-08-02 NOTE — Telephone Encounter (Signed)
Confirmed with the pharmacy, rx was received and should be able to fill today with no issues. LM for pt to return call regarding rx

## 2021-08-02 NOTE — Telephone Encounter (Signed)
Pt was made aware of rx update.

## 2021-08-02 NOTE — Telephone Encounter (Signed)
Spoke with pt and he states pharmacy told him the med was too early to fill. Will follow up with pharmacy to make sure no other issues on our end to handle and follow back up with pt

## 2021-08-19 ENCOUNTER — Other Ambulatory Visit: Payer: Self-pay | Admitting: Family Medicine

## 2021-09-19 ENCOUNTER — Other Ambulatory Visit: Payer: Self-pay | Admitting: Family Medicine

## 2021-09-26 ENCOUNTER — Ambulatory Visit: Payer: No Typology Code available for payment source | Admitting: Family Medicine

## 2021-09-26 NOTE — Progress Notes (Deleted)
OFFICE VISIT  09/26/2021  CC: No chief complaint on file.   HPI:    Patient is a 45 y.o. Caucasian male who presents for 2 mo f/u anxiety and depression. A/P as of last visit: "MDD, moderate.  Adjustment d/o with mixed anx/dep. He is a little improved from a moods standpoint, perhaps a bit more improved from an anxiety standpoint. He doesn't drink alcohol or use any drugs. I'll continue lorazepam 0.5mg , 1 bid prn, #60, RF x 1. We discussed potential addictive/dependence properties of this med. He expressed understanding and wants to remain on it. We'll increase his sertraline to 150mg  qd."  INTERIM HX: ***    PMP AWARE reviewed today: most recent rx for lorazepam 0.5 was filled 08/02/21, # 61, rx by me. No red flags.   Past Medical History:  Diagnosis Date   Acute colitis 08/2019   CT.  Presumed infectious.   Asthma    childhood only.   GAD (generalized anxiety disorder)    Hypercholesterolemia 01/2019   Mild: 2020->10 yr framingham risk= 2.5%.  02/2020 Frm risk= 3%.-->TLC   OCD (obsessive compulsive disorder)    tendencies.  ADD sx's as well.   Seasonal allergies    Vitamin D deficiency     Past Surgical History:  Procedure Laterality Date   CARDIOVASCULAR STRESS TEST  12/2017   ETT--NORMAL   VASECTOMY     vasectomy reverasal      Outpatient Medications Prior to Visit  Medication Sig Dispense Refill   LORazepam (ATIVAN) 0.5 MG tablet 1-2 tabs po bid prn anxiety 60 tablet 1   nystatin (MYCOSTATIN) 100000 UNIT/ML suspension SWISH AND SPIT 5 MLS 4 (FOUR) TIMES DAILY AS NEEDED FOR MOUTH PAIN. (Patient not taking: Reported on 07/28/2021) 120 mL 1   sertraline (ZOLOFT) 100 MG tablet Take 1 tablet (100 mg total) by mouth daily. Take with 50 mg sertraline daily. 90 tablet 1   sertraline (ZOLOFT) 50 MG tablet Take 1 tablet (50 mg total) by mouth daily. 30 tablet 3   No facility-administered medications prior to visit.    No Known Allergies  ROS As per  HPI  PE: Vitals with BMI 07/28/2021 04/28/2021 07/13/2020  Height 5\' 5"  5\' 5"  5\' 5"   Weight 142 lbs 3 oz 142 lbs 6 oz 148 lbs  BMI 23.66 07.1 21.97  Systolic 588 325 498  Diastolic 77 83 84  Pulse 71 80 76     ***  LABS:  ***  IMPRESSION AND PLAN:  No problem-specific Assessment & Plan notes found for this encounter.   An After Visit Summary was printed and given to the patient.  FOLLOW UP: No follow-ups on file.  Signed:  Crissie Sickles, MD           09/26/2021

## 2021-10-18 ENCOUNTER — Other Ambulatory Visit: Payer: Self-pay | Admitting: Family Medicine

## 2021-11-16 ENCOUNTER — Other Ambulatory Visit: Payer: Self-pay | Admitting: Family Medicine

## 2021-11-19 ENCOUNTER — Other Ambulatory Visit: Payer: Self-pay | Admitting: Family Medicine

## 2021-11-21 NOTE — Telephone Encounter (Signed)
LM for pt to return call to discuss.  

## 2021-11-21 NOTE — Telephone Encounter (Signed)
Rx sent. Needs f/u 4-6 wks.

## 2021-11-21 NOTE — Telephone Encounter (Signed)
Requesting:lorazepam Contract:n/a UDS:n/a Last Visit:07/28/21 Next Visit:cancelled 09/26/21 Last Refill:07/28/21 (60,1)  Please Advise

## 2021-11-23 ENCOUNTER — Other Ambulatory Visit: Payer: Self-pay | Admitting: Family Medicine

## 2021-11-25 NOTE — Telephone Encounter (Signed)
LM for pt to return call to discuss.  

## 2021-11-28 ENCOUNTER — Other Ambulatory Visit: Payer: Self-pay | Admitting: Family Medicine

## 2021-11-28 NOTE — Telephone Encounter (Signed)
MyChart sent regarding medication and appt needed

## 2021-11-30 ENCOUNTER — Telehealth: Payer: Self-pay

## 2021-11-30 MED ORDER — SERTRALINE HCL 50 MG PO TABS: 50.0000 mg | ORAL_TABLET | Freq: Every day | ORAL | 0 refills | Status: DC

## 2021-11-30 NOTE — Telephone Encounter (Signed)
Refill sent, pt advised.

## 2021-11-30 NOTE — Telephone Encounter (Signed)
Pt states that the zoloft 50 mg is not at the pharmacy and he needs it to be sent to [facility]. He was able to get the 100mg  Rx filled but not the 50 mg. Pt is scheduled for 12/05/21.

## 2021-11-30 NOTE — Addendum Note (Signed)
Addended by: Deveron Furlong D on: 11/30/2021 05:04 PM   Modules accepted: Orders

## 2021-12-05 ENCOUNTER — Ambulatory Visit: Payer: No Typology Code available for payment source | Admitting: Family Medicine

## 2021-12-05 NOTE — Progress Notes (Deleted)
OFFICE VISIT  12/05/2021  CC: No chief complaint on file.   HPI:    Patient is a 45 y.o. male who presents for 4 mo f/u anxiety/depression. A/P as of last visit: "MDD, moderate.  Adjustment d/o with mixed anx/dep. He is a little improved from a moods standpoint, perhaps a bit more improved from an anxiety standpoint. He doesn't drink alcohol or use any drugs. I'll continue lorazepam 0.5mg , 1 bid prn, #60, RF x 1. We discussed potential addictive/dependence properties of this med. He expressed understanding and wants to remain on it. We'll increase his sertraline to 150mg  qd."  INTERIM HX: ***  Past Medical History:  Diagnosis Date   Acute colitis 08/2019   CT.  Presumed infectious.   Asthma    childhood only.   GAD (generalized anxiety disorder)    Hypercholesterolemia 01/2019   Mild: 2020->10 yr framingham risk= 2.5%.  02/2020 Frm risk= 3%.-->TLC   OCD (obsessive compulsive disorder)    tendencies.  ADD sx's as well.   Seasonal allergies    Vitamin D deficiency     Past Surgical History:  Procedure Laterality Date   CARDIOVASCULAR STRESS TEST  12/2017   ETT--NORMAL   VASECTOMY     vasectomy reverasal      Outpatient Medications Prior to Visit  Medication Sig Dispense Refill   LORazepam (ATIVAN) 0.5 MG tablet TAKE 1 TO 2 TABLETS BY MOUTH TWICE A DAY AS NEEDED FOR ANXIETY 60 tablet 1   nystatin (MYCOSTATIN) 100000 UNIT/ML suspension SWISH AND SPIT 5 MLS 4 (FOUR) TIMES DAILY AS NEEDED FOR MOUTH PAIN. (Patient not taking: Reported on 07/28/2021) 120 mL 1   sertraline (ZOLOFT) 100 MG tablet Take 1 tablet (100 mg total) by mouth daily. Take with 50 mg sertraline daily. 90 tablet 1   sertraline (ZOLOFT) 50 MG tablet Take 1 tablet (50 mg total) by mouth daily. 30 tablet 0   No facility-administered medications prior to visit.    No Known Allergies  ROS As per HPI  PE: Vitals with BMI 07/28/2021 04/28/2021 07/13/2020  Height 5\' 5"  5\' 5"  5\' 5"   Weight 142 lbs 3 oz 142  lbs 6 oz 148 lbs  BMI 23.66 11.5 72.62  Systolic 035 597 416  Diastolic 77 83 84  Pulse 71 80 76     ***  LABS:    Chemistry      Component Value Date/Time   NA 140 03/09/2020 0906   K 4.5 03/09/2020 0906   CL 104 03/09/2020 0906   CO2 27 03/09/2020 0906   BUN 12 03/09/2020 0906   CREATININE 0.92 03/09/2020 0906      Component Value Date/Time   CALCIUM 9.6 03/09/2020 0906   ALKPHOS 46 08/31/2019 0650   AST 21 03/09/2020 0906   ALT 17 03/09/2020 0906   BILITOT 1.1 03/09/2020 0906     Lab Results  Component Value Date   TSH 1.98 03/09/2020    IMPRESSION AND PLAN:  No problem-specific Assessment & Plan notes found for this encounter.   An After Visit Summary was printed and given to the patient.  FOLLOW UP: No follow-ups on file.  Signed:  Crissie Sickles, MD           12/05/2021

## 2022-01-03 ENCOUNTER — Other Ambulatory Visit: Payer: Self-pay | Admitting: Family Medicine

## 2022-01-06 ENCOUNTER — Ambulatory Visit: Payer: No Typology Code available for payment source | Admitting: Family Medicine

## 2022-01-06 NOTE — Progress Notes (Deleted)
OFFICE VISIT  01/06/2022  CC: No chief complaint on file.   HPI:    Patient is a 46 y.o. male who presents for 5 mo f/u GAD and MDD. A/P as of last visit: "MDD, moderate.  Adjustment d/o with mixed anx/dep. He is a little improved from a moods standpoint, perhaps a bit more improved from an anxiety standpoint. He doesn't drink alcohol or use any drugs. I'll continue lorazepam 0.5mg , 1 bid prn, #60, RF x 1. We discussed potential addictive/dependence properties of this med. He expressed understanding and wants to remain on it. We'll increase his sertraline to 150mg  qd."  INTERIM HX: ***   PMP AWARE reviewed today: most recent rx for lorazepam 0.5mg  was filled 11/21/21, # 29, rx by me. No red flags.  Past Medical History:  Diagnosis Date   Acute colitis 08/2019   CT.  Presumed infectious.   Asthma    childhood only.   GAD (generalized anxiety disorder)    Hypercholesterolemia 01/2019   Mild: 2020->10 yr framingham risk= 2.5%.  02/2020 Frm risk= 3%.-->TLC   OCD (obsessive compulsive disorder)    tendencies.  ADD sx's as well.   Seasonal allergies    Vitamin D deficiency     Past Surgical History:  Procedure Laterality Date   CARDIOVASCULAR STRESS TEST  12/2017   ETT--NORMAL   VASECTOMY     vasectomy reverasal      Outpatient Medications Prior to Visit  Medication Sig Dispense Refill   LORazepam (ATIVAN) 0.5 MG tablet TAKE 1 TO 2 TABLETS BY MOUTH TWICE A DAY AS NEEDED FOR ANXIETY 60 tablet 1   nystatin (MYCOSTATIN) 100000 UNIT/ML suspension SWISH AND SPIT 5 MLS 4 (FOUR) TIMES DAILY AS NEEDED FOR MOUTH PAIN. (Patient not taking: Reported on 07/28/2021) 120 mL 1   sertraline (ZOLOFT) 100 MG tablet Take 1 tablet (100 mg total) by mouth daily. Take with 50 mg sertraline daily. 90 tablet 1   sertraline (ZOLOFT) 50 MG tablet Take 1 tablet (50 mg total) by mouth daily. 30 tablet 0   No facility-administered medications prior to visit.    No Known Allergies  ROS As per  HPI  PE: Vitals with BMI 07/28/2021 04/28/2021 07/13/2020  Height 5\' 5"  5\' 5"  5\' 5"   Weight 142 lbs 3 oz 142 lbs 6 oz 148 lbs  BMI 23.66 57.8 46.96  Systolic 295 284 132  Diastolic 77 83 84  Pulse 71 80 76     Physical Exam  ***  LABS:  Last CBC Lab Results  Component Value Date   WBC 5.5 03/09/2020   HGB 16.0 03/09/2020   HCT 48.5 03/09/2020   MCV 92.2 03/09/2020   MCH 30.4 03/09/2020   RDW 12.5 03/09/2020   PLT 221 44/12/270   Last metabolic panel Lab Results  Component Value Date   GLUCOSE 94 03/09/2020   NA 140 03/09/2020   K 4.5 03/09/2020   CL 104 03/09/2020   CO2 27 03/09/2020   BUN 12 03/09/2020   CREATININE 0.92 03/09/2020   GFRNONAA >60 08/31/2019   CALCIUM 9.6 03/09/2020   PROT 6.8 03/09/2020   ALBUMIN 4.2 08/31/2019   BILITOT 1.1 03/09/2020   ALKPHOS 46 08/31/2019   AST 21 03/09/2020   ALT 17 03/09/2020   ANIONGAP 11 08/31/2019   Last lipids Lab Results  Component Value Date   CHOL 232 (H) 03/09/2020   HDL 40 03/09/2020   LDLCALC 162 (H) 03/09/2020   TRIG 156 (H) 03/09/2020   CHOLHDL  5.8 (H) 03/09/2020   Last hemoglobin A1c No results found for: HGBA1C Last thyroid functions Lab Results  Component Value Date   TSH 1.98 03/09/2020    IMPRESSION AND PLAN:  No problem-specific Assessment & Plan notes found for this encounter.   An After Visit Summary was printed and given to the patient.  FOLLOW UP: No follow-ups on file.  Signed:  Crissie Sickles, MD           01/06/2022

## 2022-01-07 ENCOUNTER — Other Ambulatory Visit: Payer: Self-pay | Admitting: Family Medicine

## 2022-01-20 ENCOUNTER — Ambulatory Visit: Payer: No Typology Code available for payment source | Admitting: Family Medicine

## 2022-01-23 ENCOUNTER — Other Ambulatory Visit: Payer: Self-pay | Admitting: Family Medicine

## 2022-01-23 MED ORDER — SERTRALINE HCL 50 MG PO TABS: 50.0000 mg | ORAL_TABLET | Freq: Every day | ORAL | 0 refills | Status: DC

## 2022-01-23 MED ORDER — LORAZEPAM 0.5 MG PO TABS: ORAL_TABLET | ORAL | 0 refills | Status: DC

## 2022-01-23 MED ORDER — SERTRALINE HCL 100 MG PO TABS: 100.0000 mg | ORAL_TABLET | Freq: Every day | ORAL | 1 refills | Status: DC

## 2022-01-23 NOTE — Telephone Encounter (Signed)
Requesting: Lorazepam Contract: N/A UDS:N/A Last Visit:07/28/21 Next Visit:01/27/22 Last Refill: 11/21/21(60,1)  RF request for Zoloft 100 & 50MG  LOV: 07/28/21 Next ov: 01/27/22 Last written: 07/28/21(90,1) & 11/30/21(30,0)   Please review and advise. Meds pending

## 2022-01-23 NOTE — Telephone Encounter (Signed)
Spoke with pt regarding results/recommendations,voiced understanding. ? ?

## 2022-01-23 NOTE — Telephone Encounter (Signed)
Pt called about med refill, informed pt that he has an upcoming appointment for med refill. Pt expressing that he needs his meds, said I'll send note back.  LORazepam LORazepam (ATIVAN) 0.5 MG tablet  sertraline sertraline (ZOLOFT) 100 MG tablet  sertraline sertraline (ZOLOFT) 50 MG tablet   [id number] - [place], [place] - [address] Phone:  [phone]  Fax:  [phone]

## 2022-01-27 ENCOUNTER — Ambulatory Visit (INDEPENDENT_AMBULATORY_CARE_PROVIDER_SITE_OTHER): Payer: No Typology Code available for payment source | Admitting: Family Medicine

## 2022-01-27 ENCOUNTER — Other Ambulatory Visit: Payer: Self-pay

## 2022-01-27 ENCOUNTER — Encounter: Payer: Self-pay | Admitting: Family Medicine

## 2022-01-27 VITALS — BP 134/81 | HR 79 | Temp 98.1°F | Ht 65.0 in | Wt 153.8 lb

## 2022-01-27 DIAGNOSIS — F5104 Psychophysiologic insomnia: Secondary | ICD-10-CM

## 2022-01-27 DIAGNOSIS — Z1211 Encounter for screening for malignant neoplasm of colon: Secondary | ICD-10-CM

## 2022-01-27 DIAGNOSIS — Z0000 Encounter for general adult medical examination without abnormal findings: Secondary | ICD-10-CM | POA: Diagnosis not present

## 2022-01-27 DIAGNOSIS — F325 Major depressive disorder, single episode, in full remission: Secondary | ICD-10-CM | POA: Diagnosis not present

## 2022-01-27 DIAGNOSIS — E7800 Pure hypercholesterolemia, unspecified: Secondary | ICD-10-CM | POA: Diagnosis not present

## 2022-01-27 MED ORDER — SERTRALINE HCL 50 MG PO TABS: 50.0000 mg | ORAL_TABLET | Freq: Every day | ORAL | 3 refills | Status: DC

## 2022-01-27 MED ORDER — LORAZEPAM 0.5 MG PO TABS: ORAL_TABLET | ORAL | 5 refills | Status: DC

## 2022-01-27 NOTE — Progress Notes (Signed)
OFFICE VISIT  01/27/2022  CC:  Chief Complaint  Patient presents with   Follow-up    Anxiety/depression/medication   HPI:    Patient is a 46 y.o. male who presents for 6 mo f/u anxiety and depression (high risk med use). A/P as of last visit: "MDD, moderate.  Adjustment d/o with mixed anx/dep. He is a little improved from a moods standpoint, perhaps a bit more improved from an anxiety standpoint. He doesn't drink alcohol or use any drugs. I'll continue lorazepam 0.5mg , 1 bid prn, #60, RF x 1. We discussed potential addictive/dependence properties of this med. He expressed understanding and wants to remain on it. We'll increase his sertraline to 150mg  qd."  INTERIM HX: Patient has been doing much better.  He is getting some sleep as long as he takes his lorazepam.  He feels like his mood and anxiety have lifted quite a bit and he is stable. His wife has gone through all of her brain cancer treatment and is feeling well. His kids are fine, he has 5 between the ages of 47 and 2  Requests CPE today.  He is not fasting.  PMP AWARE reviewed today: most recent rx for lorazepam 0.5mg  was filled 01/23/22, # 36, rx by me. No red flags.  Past Medical History:  Diagnosis Date   Acute colitis 08/2019   CT.  Presumed infectious.   Asthma    childhood only.   GAD (generalized anxiety disorder)    Hypercholesterolemia 01/2019   Mild: 2020->10 yr framingham risk= 2.5%.  02/2020 Frm risk= 3%.-->TLC   OCD (obsessive compulsive disorder)    tendencies.  ADD sx's as well.   Seasonal allergies    Vitamin D deficiency     Past Surgical History:  Procedure Laterality Date   CARDIOVASCULAR STRESS TEST  12/2017   ETT--NORMAL   VASECTOMY     vasectomy reverasal      Outpatient Medications Prior to Visit  Medication Sig Dispense Refill   nystatin (MYCOSTATIN) 100000 UNIT/ML suspension SWISH AND SPIT 5 MLS 4 (FOUR) TIMES DAILY AS NEEDED FOR MOUTH PAIN. 120 mL 1   sertraline (ZOLOFT) 100 MG  tablet Take 1 tablet (100 mg total) by mouth daily. Take with 50 mg sertraline daily. 90 tablet 1   LORazepam (ATIVAN) 0.5 MG tablet TAKE 1 TO 2 TABLETS BY MOUTH TWICE A DAY AS NEEDED FOR ANXIETY 60 tablet 0   sertraline (ZOLOFT) 50 MG tablet Take 1 tablet (50 mg total) by mouth daily. 90 tablet 0   No facility-administered medications prior to visit.    No Known Allergies  ROS As per HPI  PE: Vitals with BMI 01/27/2022 07/28/2021 04/28/2021  Height 5\' 5"  5\' 5"  5\' 5"   Weight 153 lbs 13 oz 142 lbs 3 oz 142 lbs 6 oz  BMI 25.59 40.98 11.9  Systolic 147 829 562  Diastolic 81 77 83  Pulse 79 71 80     Physical Exam  Gen: Alert, well appearing.  Patient is oriented to person, place, time, and situation. AFFECT: pleasant, lucid thought and speech. ENT: Ears: EACs clear, normal epithelium.  TMs with good light reflex and landmarks bilaterally.  Eyes: no injection, icteris, swelling, or exudate.  EOMI, PERRLA. Nose: no drainage or turbinate edema/swelling.  No injection or focal lesion.  Mouth: lips without lesion/swelling.  Oral mucosa pink and moist.  Dentition intact and without obvious caries or gingival swelling.  Oropharynx without erythema, exudate, or swelling.  Neck: supple/nontender.  No LAD, mass,  or TM.  Carotid pulses 2+ bilaterally, without bruits. CV: RRR, no m/r/g.   LUNGS: CTA bilat, nonlabored resps, good aeration in all lung fields. ABD: soft, NT, ND, BS normal.  No hepatospenomegaly or mass.  No bruits. EXT: no clubbing, cyanosis, or edema.  Musculoskeletal: no joint swelling, erythema, warmth, or tenderness.  ROM of all joints intact. Skin - no sores or suspicious lesions or rashes or color changes   LABS:  Last CBC Lab Results  Component Value Date   WBC 5.5 03/09/2020   HGB 16.0 03/09/2020   HCT 48.5 03/09/2020   MCV 92.2 03/09/2020   MCH 30.4 03/09/2020   RDW 12.5 03/09/2020   PLT 221 03/54/6568   Last metabolic panel Lab Results  Component Value Date    GLUCOSE 94 03/09/2020   NA 140 03/09/2020   K 4.5 03/09/2020   CL 104 03/09/2020   CO2 27 03/09/2020   BUN 12 03/09/2020   CREATININE 0.92 03/09/2020   GFRNONAA >60 08/31/2019   CALCIUM 9.6 03/09/2020   PROT 6.8 03/09/2020   ALBUMIN 4.2 08/31/2019   BILITOT 1.1 03/09/2020   ALKPHOS 46 08/31/2019   AST 21 03/09/2020   ALT 17 03/09/2020   ANIONGAP 11 08/31/2019   Last lipids Lab Results  Component Value Date   CHOL 232 (H) 03/09/2020   HDL 40 03/09/2020   LDLCALC 162 (H) 03/09/2020   TRIG 156 (H) 03/09/2020   CHOLHDL 5.8 (H) 03/09/2020   Last thyroid functions Lab Results  Component Value Date   TSH 1.98 03/09/2020   IMPRESSION AND PLAN:  #1 history of major depressive disorder, in complete remission.  History of anxiety, doing well with occasional use of lorazepam in the daytime.  He has anxiety related insomnia that responds well to nightly use of lorazepam.  He is using this medication appropriately. Continue lorazepam 0.5 mg, 1-2 twice daily as needed, #60, refill x5. Continue sertraline 100 mg every morning and 50 mg every afternoon.  2. Health maintenance exam: Reviewed age and gender appropriate health maintenance issues (prudent diet, regular exercise, health risks of tobacco and excessive alcohol, use of seatbelts, fire alarms in home, use of sunscreen).  Also reviewed age and gender appropriate health screening as well as vaccine recommendations. Vaccines: declined flu.  Tdap is up-to-date. Labs: return for fasting health panel. Prostate ca screening: average risk patient= as per latest guidelines, start screening at 13 yrs of age. Colon ca screening: referred to GI for colonoscopy today.  An After Visit Summary was printed and given to the patient.  FOLLOW UP: Return in about 6 months (around 07/27/2022) for routine chronic illness f/u.  Signed:  Crissie Sickles, MD           01/27/2022

## 2022-03-30 IMAGING — DX DG LUMBAR SPINE COMPLETE 4+V
5 series · 5 of 5 positions shown · non-contrast
Comparison: 08/31/2019

CLINICAL DATA: Back pain for 2 weeks

EXAM:
LUMBAR SPINE - COMPLETE 4+ VIEW

[l-spine ap]
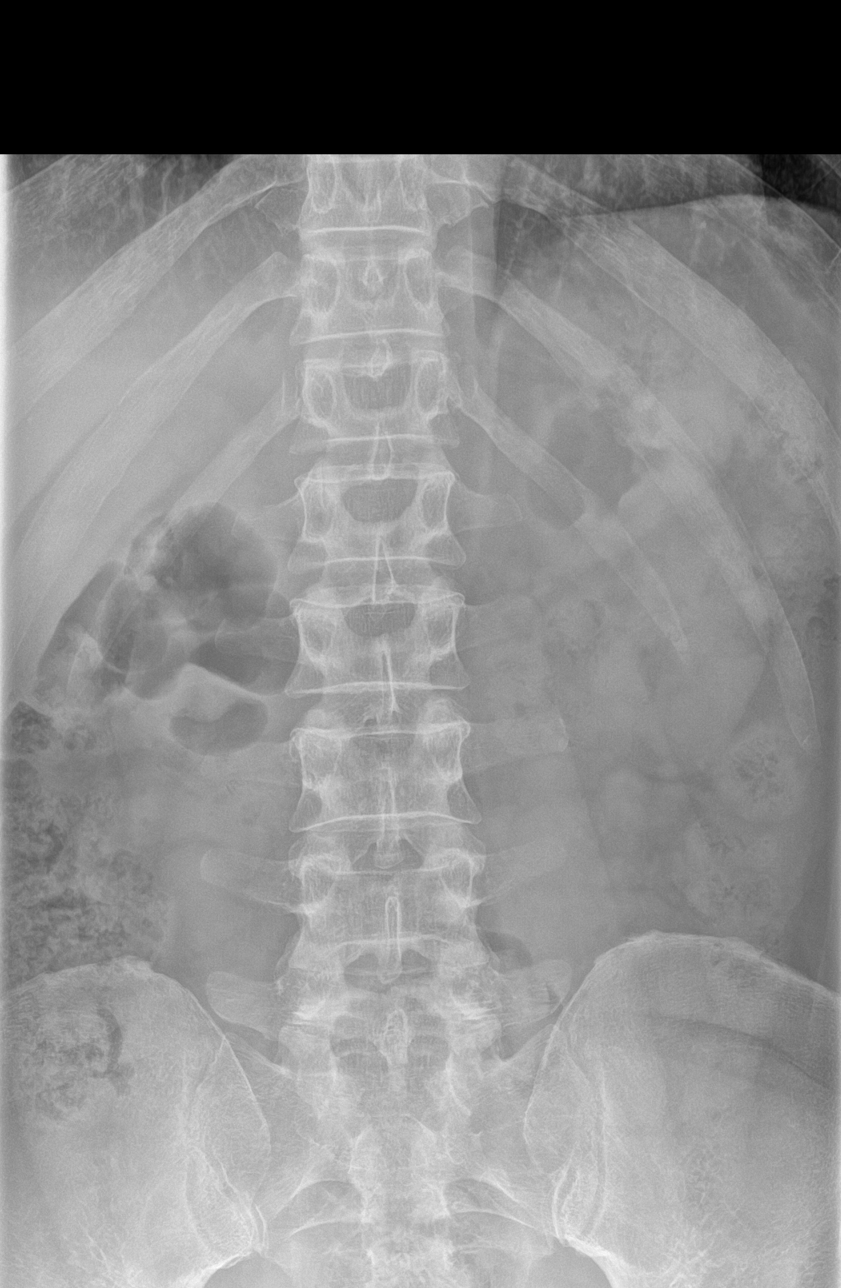

[l-spine obl (1 of 2)]
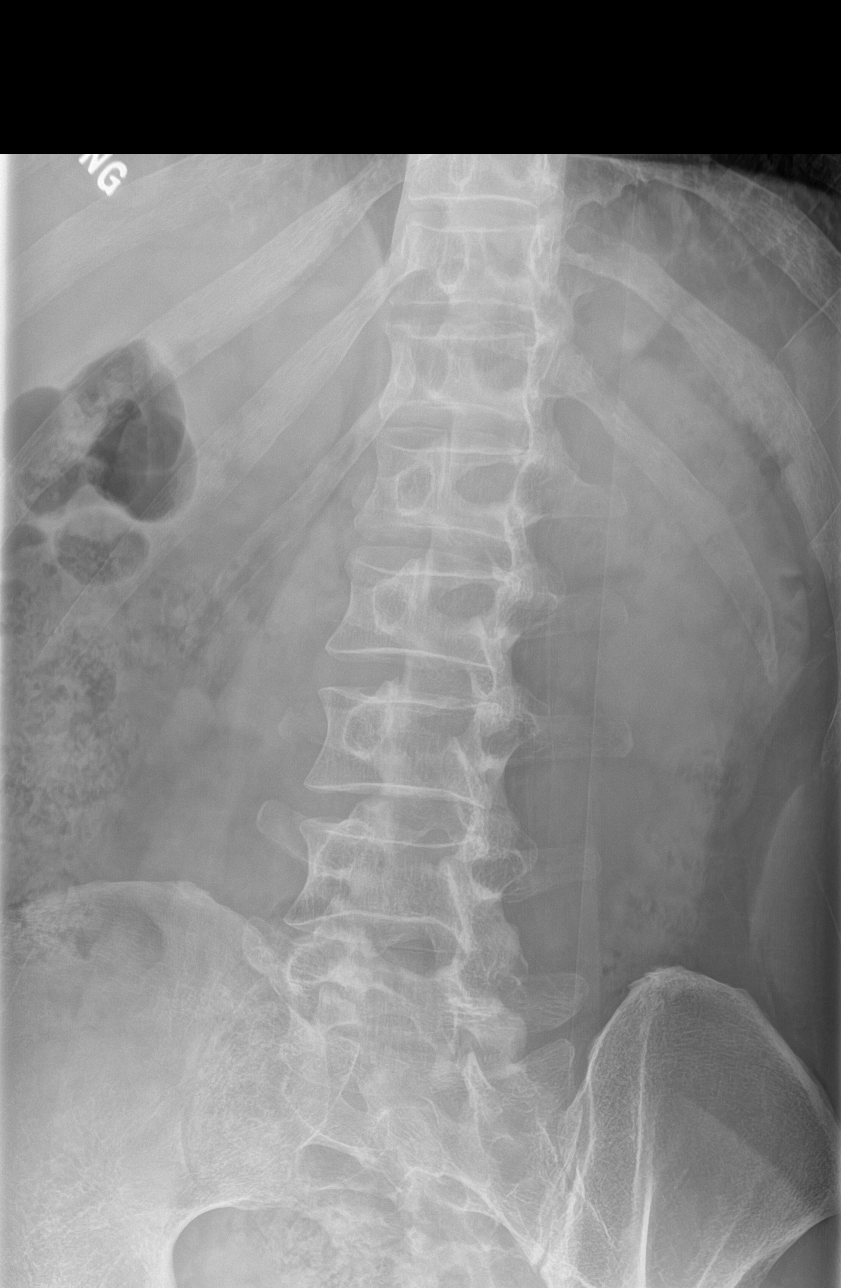

[l-spine obl (2 of 2)]
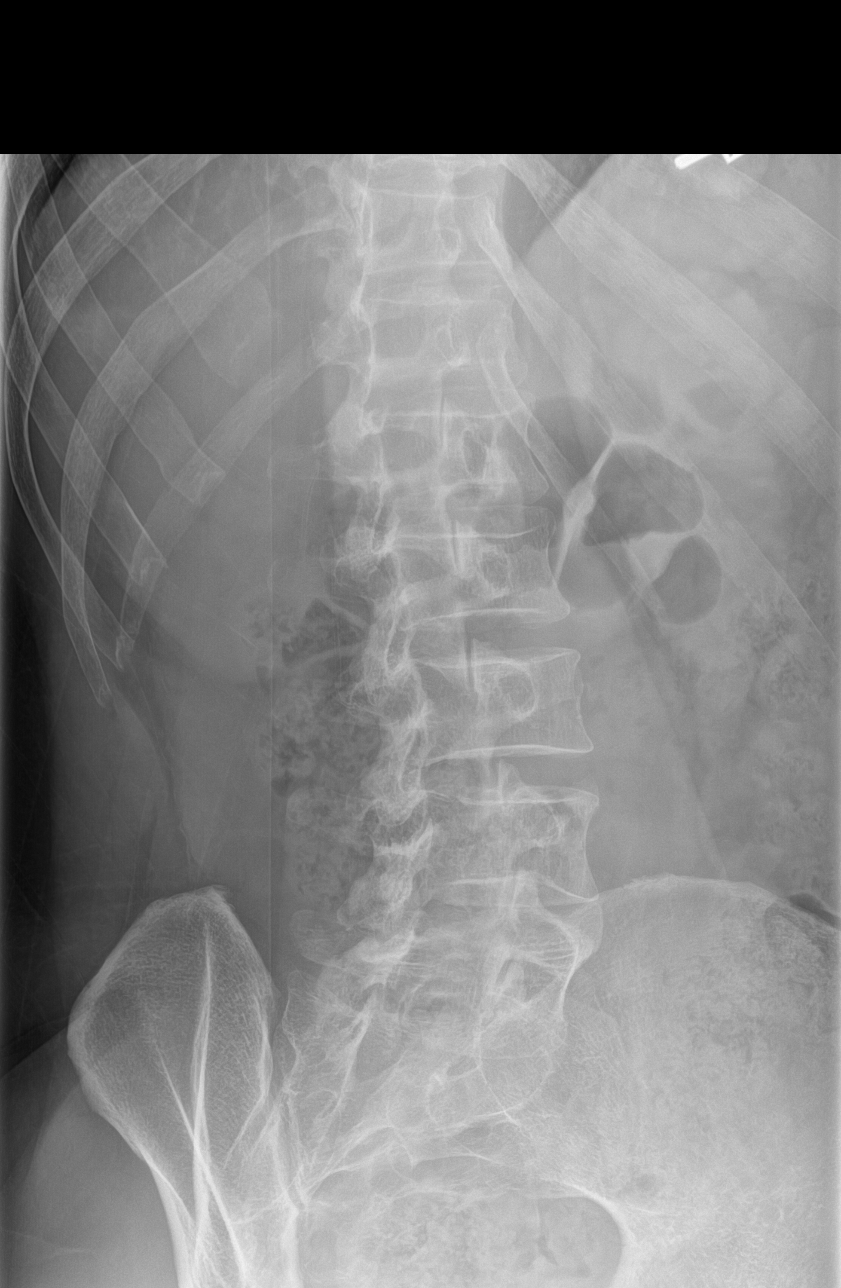

[l-spine lateral]
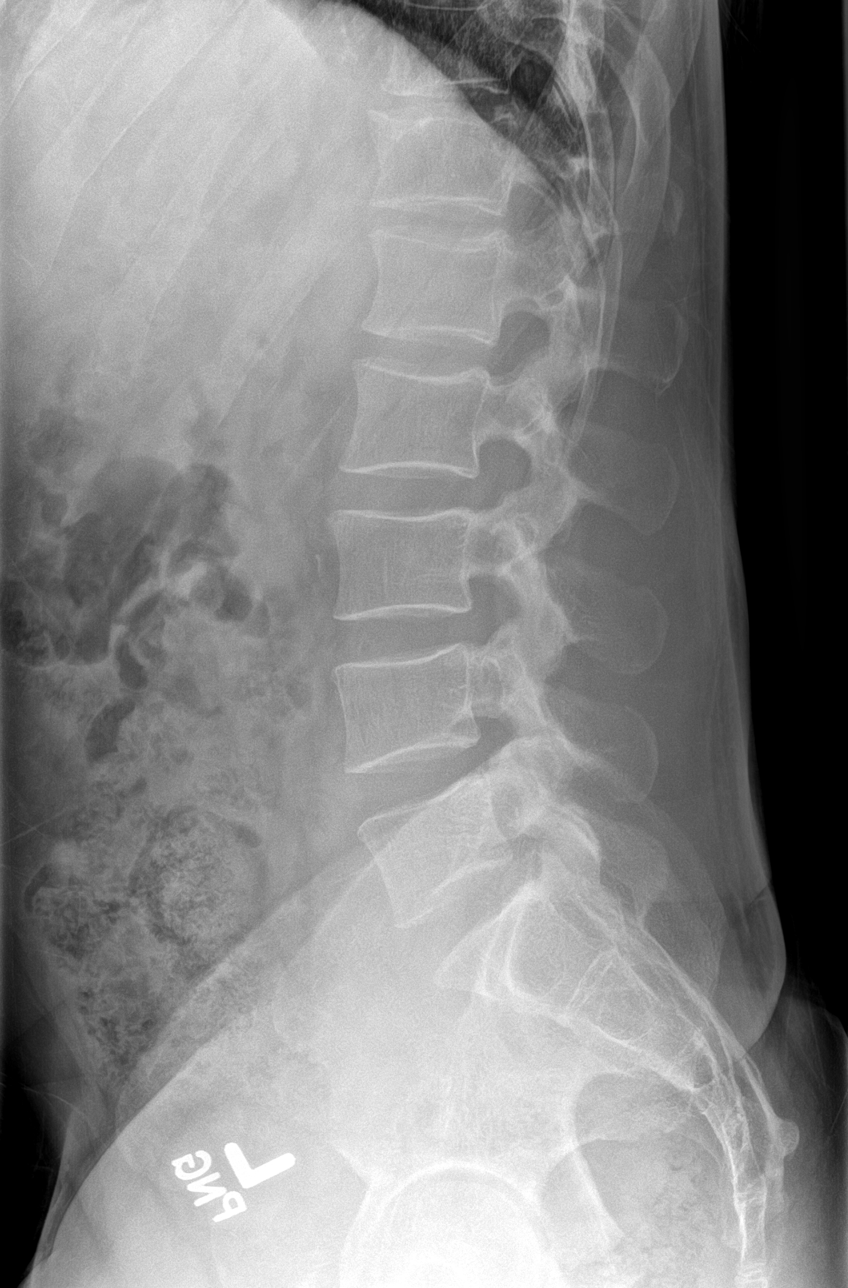

[l-spine spot]
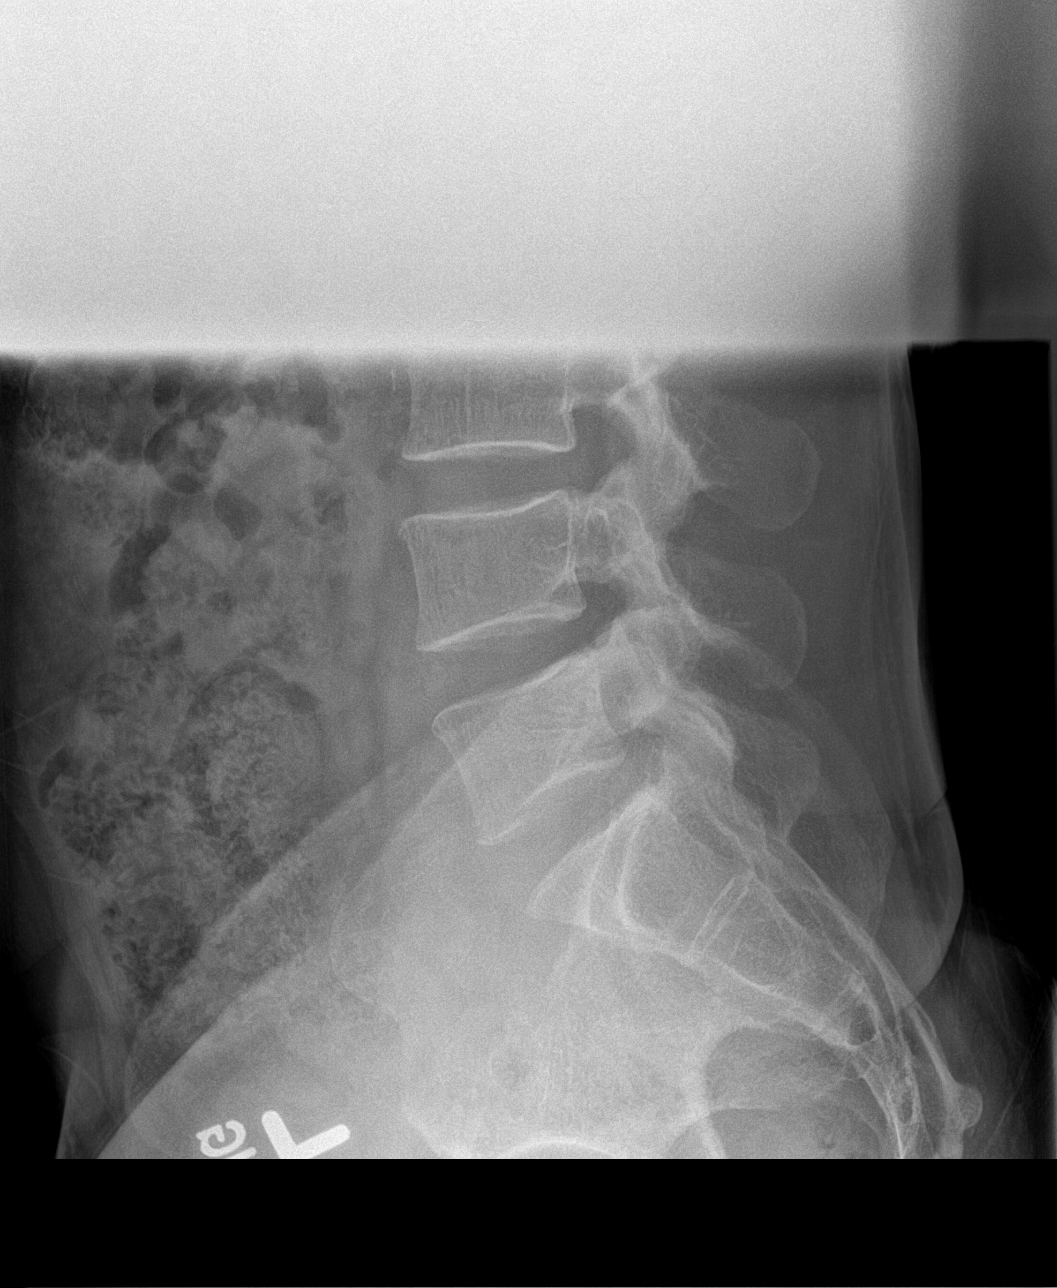

[5 of 5 positions shown; findings below may reference images not displayed]

FINDINGS: Frontal, bilateral oblique, and lateral views of the lumbar spine
are obtained. There are 5 non-rib-bearing lumbar type vertebral
bodies in normal anatomic alignment. No acute fractures. Disc spaces
are well preserved. Sacroiliac joints are normal.
IMPRESSION: 1. Unremarkable lumbar spine.

## 2022-04-05 ENCOUNTER — Telehealth: Payer: Self-pay | Admitting: Family Medicine

## 2022-04-05 NOTE — Telephone Encounter (Signed)
Warning letter okay but no dismissal. ?

## 2022-04-05 NOTE — Telephone Encounter (Signed)
Multiple no shows ?09/26/2021 ?12/05/2021 ?01/06/2022 ? ?Last OV 01/27/2022 ?Next OV none scheduled ? ?Please advise if you want final warning letter or dismissal letter sent. ?

## 2022-04-05 NOTE — Telephone Encounter (Signed)
Please review and advise.

## 2022-04-05 NOTE — Telephone Encounter (Signed)
Please see message below

## 2022-04-14 ENCOUNTER — Encounter: Payer: Self-pay | Admitting: Family Medicine

## 2022-04-14 NOTE — Telephone Encounter (Signed)
Mailing final warning no show letter ?

## 2022-05-10 ENCOUNTER — Encounter: Payer: Self-pay | Admitting: Gastroenterology

## 2022-05-10 ENCOUNTER — Ambulatory Visit (INDEPENDENT_AMBULATORY_CARE_PROVIDER_SITE_OTHER): Payer: No Typology Code available for payment source

## 2022-05-10 DIAGNOSIS — E7800 Pure hypercholesterolemia, unspecified: Secondary | ICD-10-CM

## 2022-05-11 ENCOUNTER — Encounter: Payer: Self-pay | Admitting: Family Medicine

## 2022-05-11 LAB — CBC WITH DIFFERENTIAL/PLATELET
Absolute Monocytes: 467 cells/uL (ref 200–950)
Basophils Absolute: 21 cells/uL (ref 0–200)
Basophils Relative: 0.5 %
Eosinophils Absolute: 271 cells/uL (ref 15–500)
Eosinophils Relative: 6.6 %
HCT: 47.3 % (ref 38.5–50.0)
Hemoglobin: 15.7 g/dL (ref 13.2–17.1)
Lymphs Abs: 1337 cells/uL (ref 850–3900)
MCH: 30.5 pg (ref 27.0–33.0)
MCHC: 33.2 g/dL (ref 32.0–36.0)
MCV: 92 fL (ref 80.0–100.0)
MPV: 9.8 fL (ref 7.5–12.5)
Monocytes Relative: 11.4 %
Neutro Abs: 2005 cells/uL (ref 1500–7800)
Neutrophils Relative %: 48.9 %
Platelets: 211 10*3/uL (ref 140–400)
RBC: 5.14 10*6/uL (ref 4.20–5.80)
RDW: 12.7 % (ref 11.0–15.0)
Total Lymphocyte: 32.6 %
WBC: 4.1 10*3/uL (ref 3.8–10.8)

## 2022-05-11 LAB — COMPREHENSIVE METABOLIC PANEL
AG Ratio: 1.6 (calc) (ref 1.0–2.5)
ALT: 16 U/L (ref 9–46)
AST: 20 U/L (ref 10–40)
Albumin: 4.2 g/dL (ref 3.6–5.1)
Alkaline phosphatase (APISO): 45 U/L (ref 36–130)
BUN: 15 mg/dL (ref 7–25)
CO2: 24 mmol/L (ref 20–32)
Calcium: 9.2 mg/dL (ref 8.6–10.3)
Chloride: 105 mmol/L (ref 98–110)
Creat: 0.95 mg/dL (ref 0.60–1.29)
Globulin: 2.6 g/dL (calc) (ref 1.9–3.7)
Glucose, Bld: 89 mg/dL (ref 65–99)
Potassium: 4.3 mmol/L (ref 3.5–5.3)
Sodium: 140 mmol/L (ref 135–146)
Total Bilirubin: 0.5 mg/dL (ref 0.2–1.2)
Total Protein: 6.8 g/dL (ref 6.1–8.1)

## 2022-05-11 LAB — LIPID PANEL
Cholesterol: 190 mg/dL (ref <200)
HDL: 32 mg/dL — ABNORMAL LOW (ref >=40)
LDL Cholesterol (Calc): 136 mg/dL (calc) — ABNORMAL HIGH
Non-HDL Cholesterol (Calc): 158 mg/dL (calc) — ABNORMAL HIGH (ref <130)
Total CHOL/HDL Ratio: 5.9 (calc) — ABNORMAL HIGH (ref <5.0)
Triglycerides: 116 mg/dL (ref <150)

## 2022-05-11 LAB — TSH: TSH: 1.38 mIU/L (ref 0.40–4.50)

## 2022-05-12 NOTE — Progress Notes (Unsigned)
[place] Phone: [phone] Subjective:   Dennis [name], am serving as a scribe for [name].  This visit occurred during the SARS-CoV-2 public health emergency.  Safety protocols were in place, including screening questions prior to the visit, additional usage of staff PPE, and extensive cleaning of exam room while observing appropriate contact time as indicated for disinfecting solutions.    I'm seeing this patient by the request  of:  [name], [name], MD  CC: Back pain  [id number]  Dennis [name] is a 46 y.o. male coming in with complaint of back and neck pain. OMT in 2021. Patient states that he is having R hip pain in lateral aspect. Patient does feel pain is worse when holding his child. Pain also present in thoracic and lumbar spine. Denies any radiating pain.   Patient also notes not having feeling in L lateral ankle. Has had symptoms for years.   Bunions present on both feet. L>R. L foot is painful. Patient wears orthotics for back and feet but is unsure if they are helping.   Medications patient has been prescribed:   Taking:         Reviewed prior external information including notes and imaging from previsou exam, outside providers and external EMR if available.   As well as notes that were available from care everywhere and other healthcare systems.  Past medical history, social, surgical and family history all reviewed in electronic medical record.  [name] pertanent information unless stated regarding to the chief complaint.   Past Medical History:  Diagnosis Date   Acute colitis 08/2019   CT.  Presumed infectious.   Asthma    childhood only.   GAD (generalized anxiety disorder)    Hypercholesterolemia 01/2019   Mild: 2020->10 yr framingham risk= 2.5%.  02/2020 Frm risk= 3%.-->TLC. 04/2022 3.2%.   OCD (obsessive compulsive disorder)    tendencies.  ADD sx's as well.    Seasonal allergies    Vitamin D deficiency     [name] Known Allergies   Review of Systems:  [name] headache, visual changes, nausea, vomiting, diarrhea, constipation, dizziness, abdominal pain, skin rash, fevers, chills, night sweats, weight loss, swollen lymph nodes, body aches, joint swelling, chest pain, shortness of breath, mood changes. POSITIVE muscle aches  Objective  Blood pressure 110/84, pulse 88, height '5\' 5"'$  (1.651 m), weight 150 lb (68 kg), SpO2 97 %.   General: [name] apparent distress alert and oriented x3 mood and affect normal, dressed appropriately.  HEENT: Pupils equal, extraocular movements intact  Respiratory: Patient's speak in full sentences and does not appear short of breath  Cardiovascular: [name] lower extremity edema, non tender, [name] erythema  Back exam does show the patient does have tenderness to palpation over the right sacroiliac joint.  Patient does have a positive FABER test, negative straight leg test. Foot exam does have breakdown of the transverse arch noted right greater than left.  Patient does have bunion and bunionette formation noted.  Mild overpronation of the hindfoot   Osteopathic findings   T9 extended rotated and side bent left L1 flexed rotated and side bent right Sacrum right on right       Assessment and Plan:  Right lumbar radiculopathy Has had difficulty with this previously.  Has responded to osteopathic manipulation.  Discussed icing regimen and home exercises.  We started osteopathic manipulation.  Negative x-rays of evaluate and follow-up with me again in 5  to 6 weeks.  Gabapentin prescribed today.  Nonallopathic lesion of lumbar region   Decision today to treat with OMT was based on Physical Exam  After verbal consent patient was treated with HVLA, ME, FPR techniques in  thoracic,  lumbar and sacral areas, all areas are chronic   Patient tolerated the procedure well with improvement in symptoms  Patient given exercises, stretches  and lifestyle modifications  See medications in patient instructions if given  Patient will follow up in 4-8 weeks  Loss of transverse plantar arch Patient does have breakdown of the transverse arch noted.  Discussed icing regimen and home exercises.  Discussed recovery sandals in the house, over-the-counter orthotics with patient custom orthotics being likely too strong.  Follow-up with me again in 6 to 8 weeks   Nonallopathic problems  Decision today to treat with OMT was based on Physical Exam  After verbal consent patient was treated with HVLA, ME, FPR techniques in cervical, rib, thoracic, lumbar, and sacral  areas  Patient tolerated the procedure well with improvement in symptoms  Patient given exercises, stretches and lifestyle modifications  See medications in patient instructions if given  Patient will follow up in 4-8 weeks      The above documentation has been reviewed and is accurate and complete Lyndal Pulley, DO       Note: This dictation was prepared with Dragon dictation along with smaller phrase technology. Any transcriptional errors that result from this process are unintentional.

## 2022-05-12 NOTE — Telephone Encounter (Signed)
Pt advised of results. 

## 2022-05-12 NOTE — Telephone Encounter (Signed)
His results are good. Your cholesterol is improved.  Certainly no need for any medication.

## 2022-05-15 ENCOUNTER — Ambulatory Visit (INDEPENDENT_AMBULATORY_CARE_PROVIDER_SITE_OTHER): Payer: No Typology Code available for payment source | Admitting: Family Medicine

## 2022-05-15 ENCOUNTER — Ambulatory Visit (INDEPENDENT_AMBULATORY_CARE_PROVIDER_SITE_OTHER): Payer: No Typology Code available for payment source

## 2022-05-15 ENCOUNTER — Encounter: Payer: Self-pay | Admitting: Family Medicine

## 2022-05-15 ENCOUNTER — Ambulatory Visit: Payer: Self-pay

## 2022-05-15 VITALS — BP 110/84 | HR 88 | Ht 65.0 in | Wt 150.0 lb

## 2022-05-15 DIAGNOSIS — M999 Biomechanical lesion, unspecified: Secondary | ICD-10-CM | POA: Diagnosis not present

## 2022-05-15 DIAGNOSIS — M79672 Pain in left foot: Secondary | ICD-10-CM

## 2022-05-15 DIAGNOSIS — M9902 Segmental and somatic dysfunction of thoracic region: Secondary | ICD-10-CM

## 2022-05-15 DIAGNOSIS — M5416 Radiculopathy, lumbar region: Secondary | ICD-10-CM

## 2022-05-15 DIAGNOSIS — M9904 Segmental and somatic dysfunction of sacral region: Secondary | ICD-10-CM | POA: Diagnosis not present

## 2022-05-15 DIAGNOSIS — M216X9 Other acquired deformities of unspecified foot: Secondary | ICD-10-CM

## 2022-05-15 MED ORDER — GABAPENTIN 100 MG PO CAPS: 200.0000 mg | ORAL_CAPSULE | Freq: Every day | ORAL | 0 refills | Status: DC

## 2022-05-15 NOTE — Assessment & Plan Note (Signed)
Has had difficulty with this previously.  Has responded to osteopathic manipulation.  Discussed icing regimen and home exercises.  We started osteopathic manipulation.  Negative x-rays of evaluate and follow-up with me again in 5 to 6 weeks.  Gabapentin prescribed today.

## 2022-05-15 NOTE — Patient Instructions (Addendum)
HOKA or OOFOS sandals in the house Spenco Total orthotics Gabapentin '200mg'$  Xray today See you again in 5-6 weeks

## 2022-05-15 NOTE — Assessment & Plan Note (Signed)
Patient does have breakdown of the transverse arch noted.  Discussed icing regimen and home exercises.  Discussed recovery sandals in the house, over-the-counter orthotics with patient custom orthotics being likely too strong.  Follow-up with me again in 6 to 8 weeks

## 2022-05-15 NOTE — Assessment & Plan Note (Signed)

## 2022-05-16 ENCOUNTER — Encounter: Payer: Self-pay | Admitting: Family Medicine

## 2022-05-20 ENCOUNTER — Other Ambulatory Visit: Payer: Self-pay | Admitting: Family Medicine

## 2022-05-26 ENCOUNTER — Ambulatory Visit: Payer: No Typology Code available for payment source | Admitting: Family Medicine

## 2022-06-05 ENCOUNTER — Ambulatory Visit (AMBULATORY_SURGERY_CENTER): Payer: Self-pay | Admitting: *Deleted

## 2022-06-05 VITALS — Ht 65.0 in | Wt 153.0 lb

## 2022-06-05 DIAGNOSIS — Z1211 Encounter for screening for malignant neoplasm of colon: Secondary | ICD-10-CM

## 2022-06-05 MED ORDER — NA SULFATE-K SULFATE-MG SULF 17.5-3.13-1.6 GM/177ML PO SOLN: 1.0000 | Freq: Once | ORAL | 0 refills | Status: AC

## 2022-06-05 NOTE — Progress Notes (Signed)

## 2022-06-14 ENCOUNTER — Encounter: Payer: Self-pay | Admitting: Gastroenterology

## 2022-06-15 NOTE — Progress Notes (Signed)
[place] Phone: [phone] Subjective:   Fontaine No, am serving as a scribe for [name].  I'm seeing this patient by the request  of:  [name], [name], MD  CC: Back pain and foot pain  [id number]  HAVEN FOSS is a 46 y.o. male coming in with complaint of back and neck pain. OMT on 5/22/203. Also seen for arch pain in foot. Patient states that he drove to [facility] and his stiffness in R lower back, leg and calf. Also traveled to the beach and noticed tightness in L calf and then a pop when he was kicking a soccer ball around. Did get orthotics and shoes.     Medications patient has been prescribed: gabapentin  Taking:         Reviewed prior external information including notes and imaging from previsou exam, outside providers and external EMR if available.   As well as notes that were available from care everywhere and other healthcare systems.  Past medical history, social, surgical and family history all reviewed in electronic medical record.  No pertanent information unless stated regarding to the chief complaint.   Past Medical History:  Diagnosis Date   Acute colitis 08/2019   CT.  Presumed infectious.   Allergy    Asthma    childhood only.   GAD (generalized anxiety disorder)    Hypercholesterolemia 01/2019   Mild: 2020->10 yr framingham risk= 2.5%.  02/2020 Frm risk= 3%.-->TLC. 04/2022 3.2%.   OCD (obsessive compulsive disorder)    tendencies.  ADD sx's as well.   Seasonal allergies    Vitamin D deficiency     No Known Allergies   Review of Systems:  No headache, visual changes, nausea, vomiting, diarrhea, constipation, dizziness, abdominal pain, skin rash, fevers, chills, night sweats, weight loss, swollen lymph nodes, body aches, joint swelling, chest pain, shortness of breath, mood changes. POSITIVE muscle aches  Objective  There were no vitals taken for  this visit.   General: No apparent distress alert and oriented x3 mood and affect normal, dressed appropriately.  HEENT: Pupils equal, extraocular movements intact  Respiratory: Patient's speak in full sentences and does not appear short of breath  Cardiovascular: No lower extremity edema, non tender, no erythema  Gait MSK:  Back low back exam does have some tenderness to palpation noted.  Patient does have still some limited range of motion of the lumbar spine in total.  Osteopathic findings   T9 extended rotated and side bent left L2 flexed rotated and side bent right Sacrum right on right       Assessment and Plan:  No problem-specific Assessment & Plan notes found for this encounter.    Nonallopathic problems  Decision today to treat with OMT was based on Physical Exam  After verbal consent patient was treated with HVLA, ME, FPR techniques in  thoracic, lumbar, and sacral  areas  Patient tolerated the procedure well with improvement in symptoms  Patient given exercises, stretches and lifestyle modifications  See medications in patient instructions if given  Patient will follow up in 4-8 weeks    The above documentation has been reviewed and is accurate and complete Lyndal Pulley, DO          Note: This dictation was prepared with Dragon dictation along with smaller phrase technology. Any transcriptional errors that result from this process are unintentional.

## 2022-06-16 ENCOUNTER — Encounter: Payer: Self-pay | Admitting: Gastroenterology

## 2022-06-20 ENCOUNTER — Encounter: Payer: Self-pay | Admitting: Gastroenterology

## 2022-06-20 ENCOUNTER — Ambulatory Visit (AMBULATORY_SURGERY_CENTER): Payer: No Typology Code available for payment source | Admitting: Gastroenterology

## 2022-06-20 VITALS — BP 104/76 | HR 63 | Temp 98.6°F | Resp 12 | Ht 65.0 in | Wt 153.0 lb

## 2022-06-20 DIAGNOSIS — D120 Benign neoplasm of cecum: Secondary | ICD-10-CM

## 2022-06-20 DIAGNOSIS — K635 Polyp of colon: Secondary | ICD-10-CM | POA: Diagnosis not present

## 2022-06-20 DIAGNOSIS — D125 Benign neoplasm of sigmoid colon: Secondary | ICD-10-CM

## 2022-06-20 DIAGNOSIS — Z1211 Encounter for screening for malignant neoplasm of colon: Secondary | ICD-10-CM

## 2022-06-20 MED ORDER — SODIUM CHLORIDE 0.9 % IV SOLN: 500.0000 mL | Freq: Once | INTRAVENOUS | Status: DC

## 2022-06-20 NOTE — Progress Notes (Signed)
Vitals-CW  Pt's states no medical or surgical changes since previsit or office visit. 

## 2022-06-21 ENCOUNTER — Ambulatory Visit (INDEPENDENT_AMBULATORY_CARE_PROVIDER_SITE_OTHER): Payer: No Typology Code available for payment source | Admitting: Family Medicine

## 2022-06-21 ENCOUNTER — Telehealth: Payer: Self-pay

## 2022-06-21 ENCOUNTER — Ambulatory Visit: Payer: Self-pay

## 2022-06-21 VITALS — BP 122/86 | HR 72 | Ht 65.0 in | Wt 153.0 lb

## 2022-06-21 DIAGNOSIS — M9903 Segmental and somatic dysfunction of lumbar region: Secondary | ICD-10-CM

## 2022-06-21 DIAGNOSIS — M5416 Radiculopathy, lumbar region: Secondary | ICD-10-CM | POA: Diagnosis not present

## 2022-06-21 DIAGNOSIS — M79669 Pain in unspecified lower leg: Secondary | ICD-10-CM

## 2022-06-21 DIAGNOSIS — M9904 Segmental and somatic dysfunction of sacral region: Secondary | ICD-10-CM

## 2022-06-21 DIAGNOSIS — M9902 Segmental and somatic dysfunction of thoracic region: Secondary | ICD-10-CM

## 2022-06-21 NOTE — Telephone Encounter (Signed)
Attempted f/u call. No answer, left Voicemail.

## 2022-06-21 NOTE — Patient Instructions (Signed)
Good to see you! Heel lifts in daily shoes Compression sleeve or socks with running or working out See you again in 6-8 weeks

## 2022-06-22 NOTE — Progress Notes (Signed)
Dennis [name],  One polyp which I removed during your recent procedure was proven to be completely benign but is considered a "pre-cancerous" polyp that MAY have grown into cancer if it had not been removed.  Studies shows that at least 20% of women over age 47 and 30% of men over age 84 have pre-cancerous polyps.  Based on current nationally recognized surveillance guidelines, I recommend that you have a repeat colonoscopy in 7 years.   If you develop any new rectal bleeding, abdominal pain or significant bowel habit changes, please contact me before then.

## 2022-06-23 NOTE — Assessment & Plan Note (Signed)
Has had radicular symptoms previously.  Discussed with patient.  Recommend seems to be doing relatively better.  Discussed the gabapentin no possibly taking it at night.  Did respond well to manipulation.  6 to 8 weeks.  Likely exacerbation from the traveling.

## 2022-07-20 ENCOUNTER — Other Ambulatory Visit: Payer: Self-pay | Admitting: Family Medicine

## 2022-07-20 NOTE — Telephone Encounter (Signed)
Pt was last seen February, pt due for 6 month follow up in August. 30 day supply will be given

## 2022-08-07 NOTE — Progress Notes (Unsigned)
  Dennis [name] [address] [place] Phone: [phone] Subjective:   IVilma [name], am serving as a scribe for [name].  I'm seeing this patient by the request  of:  [name], [name], MD  CC: back and neck pain follow up   [id number]  Dennis [name] is a 46 y.o. male coming in with complaint of back and neck pain. OMT on 06/21/2022. Patient states less tight in right leg. No new issues  Medications patient has been prescribed: Gabapentin  Taking:         Reviewed prior external information including notes and imaging from previsou exam, outside providers and external EMR if available.   As well as notes that were available from care everywhere and other healthcare systems.  Past medical history, social, surgical and family history all reviewed in electronic medical record.  No pertanent information unless stated regarding to the chief complaint.   Past Medical History:  Diagnosis Date   Acute colitis 08/2019   CT.  Presumed infectious.   Allergy    Asthma    childhood only.   GAD (generalized anxiety disorder)    Hypercholesterolemia 01/2019   Mild: 2020->10 yr framingham risk= 2.5%.  02/2020 Frm risk= 3%.-->TLC. 04/2022 3.2%.   OCD (obsessive compulsive disorder)    tendencies.  ADD sx's as well.   Seasonal allergies    Vitamin D deficiency     No Known Allergies   Review of Systems:  No headache, visual changes, nausea, vomiting, diarrhea, constipation, dizziness, abdominal pain, skin rash, fevers, chills, night sweats, weight loss, swollen lymph nodes, body aches, joint swelling, chest pain, shortness of breath, mood changes. POSITIVE muscle aches  Objective  Blood pressure 116/86, pulse 81, height '5\' 5"'$  (1.651 m), weight 149 lb (67.6 kg), SpO2 96 %.   General: No apparent distress alert and oriented x3 mood and affect normal, dressed appropriately.  HEENT: Pupils equal, extraocular movements intact   Respiratory: Patient's speak in full sentences and does not appear short of breath  Cardiovascular: No lower extremity edema, non tender, no erythema  Gait MSK:  Back does have some loss of lordosis.  Some tightness noted of the hamstring on the right side but no true radicular symptoms.  Osteopathic findings   T9 extended rotated and side bent left L2 flexed rotated and side bent right L5 F RS left  Sacrum right on right       Assessment and Plan:  Right lumbar radiculopathy Continued improvement noted.  Discussed icing regimen and home exercises.  Discussed with patient to continue certain activities.  Patient has discontinued the Continue to see improvement and follow-up with me again in 6 to 8 weeks    Nonallopathic problems  Decision today to treat with OMT was based on Physical Exam  After verbal consent patient was treated with HVLA, ME, FPR techniques in  thoracic, lumbar, and sacral  areas  Patient tolerated the procedure well with improvement in symptoms  Patient given exercises, stretches and lifestyle modifications  See medications in patient instructions if given  Patient will follow up in 12 weeks    The above documentation has been reviewed and is accurate and complete Lyndal Pulley, DO          Note: This dictation was prepared with Dragon dictation along with smaller phrase technology. Any transcriptional errors that result from this process are unintentional.

## 2022-08-08 ENCOUNTER — Ambulatory Visit (INDEPENDENT_AMBULATORY_CARE_PROVIDER_SITE_OTHER): Payer: No Typology Code available for payment source | Admitting: Family Medicine

## 2022-08-08 ENCOUNTER — Encounter: Payer: Self-pay | Admitting: Family Medicine

## 2022-08-08 VITALS — BP 116/86 | HR 81 | Ht 65.0 in | Wt 149.0 lb

## 2022-08-08 DIAGNOSIS — M9902 Segmental and somatic dysfunction of thoracic region: Secondary | ICD-10-CM | POA: Diagnosis not present

## 2022-08-08 DIAGNOSIS — M9903 Segmental and somatic dysfunction of lumbar region: Secondary | ICD-10-CM

## 2022-08-08 DIAGNOSIS — M9904 Segmental and somatic dysfunction of sacral region: Secondary | ICD-10-CM | POA: Diagnosis not present

## 2022-08-08 DIAGNOSIS — M5416 Radiculopathy, lumbar region: Secondary | ICD-10-CM

## 2022-08-08 NOTE — Assessment & Plan Note (Addendum)
Continued improvement noted.  Discussed icing regimen and home exercises.  Discussed with patient to continue certain activities.  Patient has discontinued the Continue to see improvement and follow-up with me again in 12 weeks

## 2022-08-08 NOTE — Patient Instructions (Signed)
Continues to get better and better Lets not change a thing

## 2022-08-13 ENCOUNTER — Other Ambulatory Visit: Payer: Self-pay | Admitting: Family Medicine

## 2022-08-17 ENCOUNTER — Other Ambulatory Visit: Payer: Self-pay | Admitting: Family Medicine

## 2022-08-21 ENCOUNTER — Other Ambulatory Visit: Payer: Self-pay | Admitting: Family Medicine

## 2022-08-21 ENCOUNTER — Encounter: Payer: Self-pay | Admitting: Family Medicine

## 2022-08-21 ENCOUNTER — Other Ambulatory Visit: Payer: Self-pay

## 2022-08-21 MED ORDER — SERTRALINE HCL 100 MG PO TABS: 100.0000 mg | ORAL_TABLET | Freq: Every day | ORAL | 0 refills | Status: DC

## 2022-08-21 NOTE — Telephone Encounter (Signed)
noted 

## 2022-08-25 ENCOUNTER — Telehealth (INDEPENDENT_AMBULATORY_CARE_PROVIDER_SITE_OTHER): Payer: No Typology Code available for payment source | Admitting: Family Medicine

## 2022-08-25 ENCOUNTER — Encounter: Payer: Self-pay | Admitting: Family Medicine

## 2022-08-25 DIAGNOSIS — F411 Generalized anxiety disorder: Secondary | ICD-10-CM

## 2022-08-25 DIAGNOSIS — F5104 Psychophysiologic insomnia: Secondary | ICD-10-CM | POA: Diagnosis not present

## 2022-08-25 DIAGNOSIS — F321 Major depressive disorder, single episode, moderate: Secondary | ICD-10-CM

## 2022-08-25 MED ORDER — LORAZEPAM 0.5 MG PO TABS: ORAL_TABLET | ORAL | 3 refills | Status: DC

## 2022-08-25 MED ORDER — SERTRALINE HCL 100 MG PO TABS
100.0000 mg | ORAL_TABLET | Freq: Every day | ORAL | 3 refills | Status: DC
Start: 2022-08-25 — End: 2023-08-31

## 2022-08-25 MED ORDER — SERTRALINE HCL 50 MG PO TABS: 50.0000 mg | ORAL_TABLET | Freq: Every day | ORAL | 3 refills | Status: DC

## 2022-08-25 NOTE — Progress Notes (Signed)
Virtual Visit via Video Note  I connected with Dennis [name] on 08/25/22 at  3:20 PM EDT by a video enabled telemedicine application and verified that I am speaking with the correct person using two identifiers.  Location patient: [place] Location provider:work or home office Persons participating in the virtual visit: patient, provider  I discussed the limitations and requested verbal permission for telemedicine visit. The patient expressed understanding and agreed to proceed.  CC: 46-year-old male being seen today for 64-monthfollow-up major depressive disorder and anxiety. A/P as of last visit: "#1 history of major depressive disorder, in complete remission.  History of anxiety, doing well with occasional use of lorazepam in the daytime.  He has anxiety related insomnia that responds well to nightly use of lorazepam.  He is using this medication appropriately. Continue lorazepam 0.5 mg, 1-2 twice daily as needed, #60, refill x5. Continue sertraline 100 mg every morning and 50 mg every afternoon.  2. Health maintenance exam: Reviewed age and gender appropriate health maintenance issues (prudent diet, regular exercise, health risks of tobacco and excessive alcohol, use of seatbelts, fire alarms in home, use of sunscreen).  Also reviewed age and gender appropriate health screening as well as vaccine recommendations. Vaccines: declined flu.  Tdap is up-to-date. Labs: return for fasting health panel. Prostate ca screening: average risk patient= as per latest guidelines, start screening at 89yrs of age. Colon ca screening: referred to GI for colonoscopy today."  INTERIM HX:  He is doing well. He continues to take 150 mg of sertraline daily as well as 1 lorazepam 0.5 mg tab at bedtime to help with anxiety-related insomnia.  Occasionally he will need 2 lorazepam at bedtime. He has started counseling and goes to this weekly and says this has been helping well. His wife has a history of brain cancer and  patient states that her most recent surveillance scan was clear.  This is great. 4 of his children just started back at school and his 47year-old is about to start school soon. He continues to work long hours and says this is going fine.  PMP AWARE reviewed today: most recent rx for lorazepam was filled 07/14/2022, #60, rx by me. No red flags.   ROS: See pertinent positives and negatives per HPI.  Past Medical History:  Diagnosis Date   Acute colitis 08/2019   CT.  Presumed infectious.   Allergy    Asthma    childhood only.   GAD (generalized anxiety disorder)    Hypercholesterolemia 01/2019   Mild: 2020->10 yr framingham risk= 2.5%.  02/2020 Frm risk= 3%.-->TLC. 04/2022 3.2%.   OCD (obsessive compulsive disorder)    tendencies.  ADD sx's as well.   Seasonal allergies    Vitamin D deficiency     Past Surgical History:  Procedure Laterality Date   CARDIOVASCULAR STRESS TEST  12/2017   ETT--NORMAL   VASECTOMY     vasectomy reverasal       Current Outpatient Medications:    gabapentin (NEURONTIN) 100 MG capsule, Take 2 capsules (200 mg total) by mouth at bedtime., Disp: 180 capsule, Rfl: 0   LORazepam (ATIVAN) 0.5 MG tablet, TAKE 1 TO 2 TABLETS BY MOUTH TWICE A DAY AS NEEDED FOR ANXIETY, Disp: 60 tablet, Rfl: 3   nystatin (MYCOSTATIN) 100000 UNIT/ML suspension, SWISH AND SPIT 5 MLS 4 (FOUR) TIMES DAILY AS NEEDED FOR MOUTH PAIN., Disp: 120 mL, Rfl: 1   sertraline (ZOLOFT) 100 MG tablet, Take 1 tablet (100 mg total) by mouth daily. Take  with 50 mg sertraline daily., Disp: 30 tablet, Rfl: 0   sertraline (ZOLOFT) 50 MG tablet, Take 1 tablet (50 mg total) by mouth daily., Disp: 90 tablet, Rfl: 3  EXAM:  VITALS per patient if applicable:     5/88/5027    2:21 PM 06/21/2022    2:21 PM 06/20/2022   10:28 AM  Vitals with BMI  Height '5\' 5"'$  '5\' 5"'$    Weight 149 lbs 153 lbs   BMI 74.12 87.86   Systolic 767 209 470  Diastolic 86 86 76  Pulse 81 72 63    GENERAL: alert, oriented,  appears well and in no acute distress  HEENT: atraumatic, conjunttiva clear, no obvious abnormalities on inspection of external nose and ears  NECK: normal movements of the head and neck  LUNGS: on inspection no signs of respiratory distress, breathing rate appears normal, no obvious gross SOB, gasping or wheezing  CV: no obvious cyanosis  MS: moves all visible extremities without noticeable abnormality  PSYCH/NEURO: pleasant and cooperative, no obvious depression or anxiety, speech and thought processing grossly intact  ASSESSMENT AND PLAN:  Discussed the following assessment and plan:  Major depressive disorder, in remission. General anxiety disorder, well controlled. Anxiety-related insomnia, well controlled. Continue sertraline 150 mg a day and lorazepam 0.5 mg, 1-2 nightly as needed. Prescriptions refilled today.   I discussed the assessment and treatment plan with the patient. The patient was provided an opportunity to ask questions and all were answered. The patient agreed with the plan and demonstrated an understanding of the instructions.  Signed:  Crissie Sickles, MD           08/25/2022

## 2022-09-15 ENCOUNTER — Encounter: Payer: Self-pay | Admitting: Family Medicine

## 2022-09-15 MED ORDER — NYSTATIN 100000 UNIT/ML MT SUSP: OROMUCOSAL | 1 refills | Status: DC

## 2022-09-15 NOTE — Telephone Encounter (Signed)
Nystatin prescription sent

## 2022-09-15 NOTE — Telephone Encounter (Signed)
Please advise if okay to refills

## 2022-10-17 NOTE — Progress Notes (Signed)
  [name] [place] Phone: [phone] Subjective:   Dennis [name], am serving as a scribe for [name].  I'm seeing this patient by the request  of:  [name], [name], MD  CC: Low back pain follow-up  [id number]  Dennis [name] is a 46 y.o. male coming in with complaint of back and neck pain. OMT on 08/08/2022. Patient states he is doing relatively well.  Has responded to osteopathic manipulation in the past.  Started to get tighter again recently.  Medications patient has been prescribed: None  Taking:         Reviewed prior external information including notes and imaging from previsou exam, outside providers and external EMR if available.   As well as notes that were available from care everywhere and other healthcare systems.  Past medical history, social, surgical and family history all reviewed in electronic medical record.  No pertanent information unless stated regarding to the chief complaint.   Past Medical History:  Diagnosis Date   Acute colitis 08/2019   CT.  Presumed infectious.   Allergy    Asthma    childhood only.   GAD (generalized anxiety disorder)    Hypercholesterolemia 01/2019   Mild: 2020->10 yr framingham risk= 2.5%.  02/2020 Frm risk= 3%.-->TLC. 04/2022 3.2%.   OCD (obsessive compulsive disorder)    tendencies.  ADD sx's as well.   Seasonal allergies    Vitamin D deficiency     No Known Allergies   Review of Systems:  No , visual changes, nausea, vomiting, diarrhea, constipation, dizziness, abdominal pain, skin rash, fevers, chills, night sweats, weight loss, swollen lymph nodes, body aches, joint swelling, chest pain, shortness of breath, mood changes. POSITIVE muscle aches, headache  Objective  Blood pressure 120/84, pulse 76, height '5\' 5"'$  (1.651 m), weight 152 lb (68.9 kg), SpO2 97 %.   General: No apparent distress alert and oriented x3 mood and affect  normal, dressed appropriately.  HEENT: Pupils equal, extraocular movements intact  Respiratory: Patient's speak in full sentences and does not appear short of breath  Cardiovascular: No lower extremity edema, non tender, no erythema  Gait MSK:  Back does have some loss of lordosis.  Tightness with FABER test.  Tender to palpation still in the paraspinal musculature  Osteopathic findings  T9 extended rotated and side bent left L2 flexed rotated and side bent right Sacrum right on right       Assessment and Plan:  Right lumbar radiculopathy Stable and responding well to osteopathic manipulation.  Has the gabapentin for breakthrough pain.  No significant changes and follow-up in 10 to 12 weeks    Nonallopathic problems  Decision today to treat with OMT was based on Physical Exam  After verbal consent patient was treated with HVLA, ME, FPR techniques in  thoracic, lumbar, and sacral  areas  Patient tolerated the procedure well with improvement in symptoms  Patient given exercises, stretches and lifestyle modifications  See medications in patient instructions if given  Patient will follow up in 4-8 weeks    The above documentation has been reviewed and is accurate and complete Lyndal Pulley, DO          Note: This dictation was prepared with Dragon dictation along with smaller phrase technology. Any transcriptional errors that result from this process are unintentional.

## 2022-10-24 ENCOUNTER — Ambulatory Visit (INDEPENDENT_AMBULATORY_CARE_PROVIDER_SITE_OTHER): Payer: No Typology Code available for payment source | Admitting: Family Medicine

## 2022-10-24 VITALS — BP 120/84 | HR 76 | Ht 65.0 in | Wt 152.0 lb

## 2022-10-24 DIAGNOSIS — M5416 Radiculopathy, lumbar region: Secondary | ICD-10-CM

## 2022-10-24 DIAGNOSIS — M9902 Segmental and somatic dysfunction of thoracic region: Secondary | ICD-10-CM | POA: Diagnosis not present

## 2022-10-24 DIAGNOSIS — M9903 Segmental and somatic dysfunction of lumbar region: Secondary | ICD-10-CM | POA: Diagnosis not present

## 2022-10-24 DIAGNOSIS — M9904 Segmental and somatic dysfunction of sacral region: Secondary | ICD-10-CM | POA: Diagnosis not present

## 2022-10-24 NOTE — Assessment & Plan Note (Signed)
Stable and responding well to osteopathic manipulation.  Has the gabapentin for breakthrough pain.  No significant changes and follow-up in 10 to 12 weeks

## 2022-10-24 NOTE — Patient Instructions (Signed)
Good to see you   Follow up in  

## 2022-12-27 NOTE — Progress Notes (Deleted)
  [place] Phone: [phone] Subjective:    I'm seeing this patient by the request  of:  [name], [name], MD  CC:   [id number]  Dennis [name] is a 47 y.o. male coming in with complaint of back and neck pain. OMT 10/24/2022. Patient states   Medications patient has been prescribed: None  Taking:         Reviewed prior external information including notes and imaging from previsou exam, outside providers and external EMR if available.   As well as notes that were available from care everywhere and other healthcare systems.  Past medical history, social, surgical and family history all reviewed in electronic medical record.  No pertanent information unless stated regarding to the chief complaint.   Past Medical History:  Diagnosis Date   Acute colitis 08/2019   CT.  Presumed infectious.   Allergy    Asthma    childhood only.   GAD (generalized anxiety disorder)    Hypercholesterolemia 01/2019   Mild: 2020->10 yr framingham risk= 2.5%.  02/2020 Frm risk= 3%.-->TLC. 04/2022 3.2%.   OCD (obsessive compulsive disorder)    tendencies.  ADD sx's as well.   Seasonal allergies    Vitamin D deficiency     No Known Allergies   Review of Systems:  No headache, visual changes, nausea, vomiting, diarrhea, constipation, dizziness, abdominal pain, skin rash, fevers, chills, night sweats, weight loss, swollen lymph nodes, body aches, joint swelling, chest pain, shortness of breath, mood changes. POSITIVE muscle aches  Objective  There were no vitals taken for this visit.   General: No apparent distress alert and oriented x3 mood and affect normal, dressed appropriately.  HEENT: Pupils equal, extraocular movements intact  Respiratory: Patient's speak in full sentences and does not appear short of breath  Cardiovascular: No lower extremity edema, non tender, no erythema  Gait MSK:  Back   Osteopathic  findings  C2 flexed rotated and side bent right C6 flexed rotated and side bent left T3 extended rotated and side bent right inhaled rib T9 extended rotated and side bent left L2 flexed rotated and side bent right Sacrum right on right       Assessment and Plan:  No problem-specific Assessment & Plan notes found for this encounter.    Nonallopathic problems  Decision today to treat with OMT was based on Physical Exam  After verbal consent patient was treated with HVLA, ME, FPR techniques in cervical, rib, thoracic, lumbar, and sacral  areas  Patient tolerated the procedure well with improvement in symptoms  Patient given exercises, stretches and lifestyle modifications  See medications in patient instructions if given  Patient will follow up in 4-8 weeks    The above documentation has been reviewed and is accurate and complete Lyndal Pulley, DO          Note: This dictation was prepared with Dragon dictation along with smaller phrase technology. Any transcriptional errors that result from this process are unintentional.

## 2023-01-02 ENCOUNTER — Ambulatory Visit: Payer: No Typology Code available for payment source | Admitting: Family Medicine

## 2023-01-23 NOTE — Progress Notes (Signed)
[name] Phone: [phone] Subjective:   Dennis [name], am serving as a scribe for Dr. Hulan [name].  I'm seeing this patient by the request  of:  [name], Dennis Blackwater, MD  CC: Back and neck pain  [id number]  Dennis [name] is a 47 y.o. male coming in with complaint of back and neck pain. OMT on 10/24/2022. Patient states that he has tightness in neck and back.   Also c/o tightness and burning in L elbow. Pain over medial aspect when fully flexed. Wakes up at night in pain. Paitent notes that the goats will   Medications patient has been prescribed: None  Taking:     X-rays of the lumbar spine were independently visualized by me showing [name] significant bony    Reviewed prior external information including notes and imaging from previsou exam, outside providers and external EMR if available.   As well as notes that were available from care everywhere and other healthcare systems.  Past medical history, social, surgical and family history all reviewed in electronic medical record.  [name] pertanent information unless stated regarding to the chief complaint.   Past Medical History:  Diagnosis Date   Acute colitis 08/2019   CT.  Presumed infectious.   Allergy    Asthma    childhood only.   GAD (generalized anxiety disorder)    Hypercholesterolemia 01/2019   Mild: 2020->10 yr framingham risk= 2.5%.  02/2020 Frm risk= 3%.-->TLC. 04/2022 3.2%.   OCD (obsessive compulsive disorder)    tendencies.  ADD sx's as well.   Seasonal allergies    Vitamin D deficiency       Review of Systems:  [name] headache, visual changes, nausea, vomiting, diarrhea, constipation, dizziness, abdominal pain, skin rash, fevers, chills, night sweats, weight loss, swollen lymph nodes, body aches, joint swelling, chest pain, shortness of breath, mood changes. POSITIVE muscle aches  Objective  Blood pressure 124/82, height '5\' 5"'$   (1.651 m), weight 153 lb (69.4 kg).   General: [name] apparent distress alert and oriented x3 mood and affect normal, dressed appropriately.  HEENT: Pupils equal, extraocular movements intact  Respiratory: Patient's speak in full sentences and does not appear short of breath  Cardiovascular: [name] lower extremity edema, non tender, [name] erythema  Back exam does have some loss of lordosis.  Patient does still have tightness on the right compared to the left paraspinal musculature.  Patient does have tightness in the right gluteal area.  Patient's neck also has significant tightness on the right side which is more than patient's baseline.  Patient does have some mild scapular dyskinesis on the right side.  Left elbow does have tenderness to palpation over the ulnar area.  The patient does have a positive cubital tunnel.  Full flexion and extension though noted.  Good grip strength noted.  [name] weakness in the C8 distribution.  Osteopathic findings  C6 flexed rotated and side bent right T3 extended rotated and side bent right inhaled rib T9 extended rotated and side bent left L2 flexed rotated and side bent right Sacrum right on right       Assessment and Plan:  Ulnar nerve compression, left WBC 10 does have some ulnar nerve subluxation with compression noted.  We discussed with patient about compression, work with Product/process development scientist to learn more nerve flossing and exercises.  Avoid full extension and flexion.  I discussed that if it does not make improvement can consider injection or  formal physical therapy.  Follow-up again in 6 to 8 weeks patient does have gabapentin and can take it if necessary.    Nonallopathic problems  Decision today to treat with OMT was based on Physical Exam  After verbal consent patient was treated with HVLA, ME, FPR techniques in cervical, thoracic, lumbar, and sacral  areas  Patient tolerated the procedure well with improvement in symptoms  Patient given exercises,  stretches and lifestyle modifications  See medications in patient instructions if given  Patient will follow up in 4-8 weeks     The above documentation has been reviewed and is accurate and complete Dennis Pulley, DO         Note: This dictation was prepared with Dragon dictation along with smaller phrase technology. Any transcriptional errors that result from this process are unintentional.

## 2023-01-25 ENCOUNTER — Ambulatory Visit: Payer: No Typology Code available for payment source | Admitting: Family Medicine

## 2023-01-25 VITALS — BP 124/82 | Ht 65.0 in | Wt 153.0 lb

## 2023-01-25 DIAGNOSIS — G5622 Lesion of ulnar nerve, left upper limb: Secondary | ICD-10-CM | POA: Diagnosis not present

## 2023-01-25 DIAGNOSIS — M9902 Segmental and somatic dysfunction of thoracic region: Secondary | ICD-10-CM | POA: Diagnosis not present

## 2023-01-25 DIAGNOSIS — M9904 Segmental and somatic dysfunction of sacral region: Secondary | ICD-10-CM | POA: Diagnosis not present

## 2023-01-25 DIAGNOSIS — M9903 Segmental and somatic dysfunction of lumbar region: Secondary | ICD-10-CM | POA: Diagnosis not present

## 2023-01-25 DIAGNOSIS — M9901 Segmental and somatic dysfunction of cervical region: Secondary | ICD-10-CM | POA: Diagnosis not present

## 2023-01-25 NOTE — Assessment & Plan Note (Addendum)
WBC 10 does have some ulnar nerve subluxation with compression noted.  We discussed with patient about compression, work with Product/process development scientist to learn more nerve flossing and exercises.  Avoid full extension and flexion.  I discussed that if it does not make improvement can consider injection or formal physical therapy.  Follow-up again in 6 to 8 weeks patient does have gabapentin and can take it if necessary.

## 2023-01-25 NOTE — Patient Instructions (Addendum)
Ulnar subluxation Compression sleeve Avoid full ext and flexion Stay away from goats See me again in 7-8 weeks

## 2023-02-27 ENCOUNTER — Ambulatory Visit (INDEPENDENT_AMBULATORY_CARE_PROVIDER_SITE_OTHER): Payer: No Typology Code available for payment source | Admitting: Family Medicine

## 2023-02-27 ENCOUNTER — Encounter: Payer: Self-pay | Admitting: Family Medicine

## 2023-02-27 VITALS — BP 104/78 | HR 67 | Ht 65.0 in | Wt 155.0 lb

## 2023-02-27 DIAGNOSIS — M9903 Segmental and somatic dysfunction of lumbar region: Secondary | ICD-10-CM

## 2023-02-27 DIAGNOSIS — M9908 Segmental and somatic dysfunction of rib cage: Secondary | ICD-10-CM | POA: Diagnosis not present

## 2023-02-27 DIAGNOSIS — M9902 Segmental and somatic dysfunction of thoracic region: Secondary | ICD-10-CM | POA: Diagnosis not present

## 2023-02-27 DIAGNOSIS — M9901 Segmental and somatic dysfunction of cervical region: Secondary | ICD-10-CM

## 2023-02-27 DIAGNOSIS — G2589 Other specified extrapyramidal and movement disorders: Secondary | ICD-10-CM

## 2023-02-27 DIAGNOSIS — M9904 Segmental and somatic dysfunction of sacral region: Secondary | ICD-10-CM | POA: Diagnosis not present

## 2023-02-27 MED ORDER — PREDNISONE 20 MG PO TABS: 40.0000 mg | ORAL_TABLET | Freq: Every day | ORAL | 0 refills | Status: DC

## 2023-02-27 NOTE — Patient Instructions (Addendum)
Good to see you! Watch for repetitive motion that could be contributing to issue Original exercises the most beneficial  See you again in 7-8 weeks

## 2023-02-27 NOTE — Progress Notes (Signed)
[place] [place] Phone: [phone] Subjective:   Fontaine No, am serving as a scribe for [name].  I'm seeing this patient by the request  of:  [name], [name], MD  CC: Back and neck pain as well as left shoulder pain  [id number]  COLSTON EINCK is a 47 y.o. male coming in with complaint of back and neck pain. OMT 01/25/2023. Also f/u for L elbow pain. Patient states that his L shoulder has been bothering him. Pain throughout the joint, muscle spasm in back and tingling radiating down to L hand. Pain began last week and no new injury.   Elbow pain has improving.  Medications patient has been prescribed:   Taking:         Reviewed prior external information including notes and imaging from previsou exam, outside providers and external EMR if available.   As well as notes that were available from care everywhere and other healthcare systems.  Past medical history, social, surgical and family history all reviewed in electronic medical record.  No pertanent information unless stated regarding to the chief complaint.   Past Medical History:  Diagnosis Date   Acute colitis 08/2019   CT.  Presumed infectious.   Allergy    Asthma    childhood only.   GAD (generalized anxiety disorder)    Hypercholesterolemia 01/2019   Mild: 2020->10 yr framingham risk= 2.5%.  02/2020 Frm risk= 3%.-->TLC. 04/2022 3.2%.   OCD (obsessive compulsive disorder)    tendencies.  ADD sx's as well.   Seasonal allergies    Vitamin D deficiency     No Known Allergies   Review of Systems:  No headache, visual changes, nausea, vomiting, diarrhea, constipation, dizziness, abdominal pain, skin rash, fevers, chills, night sweats, weight loss, swollen lymph nodes, body aches, joint swelling, chest pain, shortness of breath, mood changes. POSITIVE muscle aches  Objective  Blood pressure 104/78, pulse 67, height '5\' 5"'$  (1.651  m), weight 155 lb (70.3 kg), SpO2 97 %.   General: No apparent distress alert and oriented x3 mood and affect normal, dressed appropriately.  HEENT: Pupils equal, extraocular movements intact  Respiratory: Patient's speak in full sentences and does not appear short of breath  Cardiovascular: No lower extremity edema, non tender, no erythema  Patient does have some scapular dyskinesis noted on the left shoulder joint.  Patient does have mild positive impingement.  Rotator cuff with certain range of motion.  Negative crossover, negative O'Brien's.  Osteopathic findings  C6 flexed rotated and side bent left T3 extended rotated and side bent left inhaled rib T9 extended rotated and side bent left inhaled. L2 flexed rotated and side bent right Sacrum right on right       Assessment and Plan:  Scapular dyskinesis Discussed with patient again about icing regimen and home exercises, which activities to do and which ones to avoid, patient does have some scapular dyskinesis and do think that some home exercises would be beneficial.  Patient will work on posture and ergonomics, continue to stay active otherwise.  No sign of any cervical radiculopathy that should be contributing.  Follow-up again in 6 to 8 weeks.    Nonallopathic problems  Decision today to treat with OMT was based on Physical Exam  After verbal consent patient was treated with HVLA, ME, FPR techniques in cervical, rib, thoracic, lumbar, and sacral  areas  Patient tolerated the procedure well with improvement in symptoms  Patient given exercises, stretches and lifestyle modifications  See medications in patient instructions if given  Patient will follow up in 4-8 weeks     The above documentation has been reviewed and is accurate and complete Lyndal Pulley, DO         Note: This dictation was prepared with Dragon dictation along with smaller phrase technology. Any transcriptional errors that result from this  process are unintentional.

## 2023-02-27 NOTE — Assessment & Plan Note (Addendum)
Discussed with patient again about icing regimen and home exercises, which activities to do and which ones to avoid, patient does have some scapular dyskinesis and do think that some home exercises would be beneficial.  Patient will work on posture and ergonomics, continue to stay active otherwise.  No sign of any cervical radiculopathy that should be contributing.  Follow-up again in 6 to 8 weeks.  Discussed gabapentin as well as the prednisone if necessary.

## 2023-02-28 ENCOUNTER — Other Ambulatory Visit: Payer: Self-pay | Admitting: Family Medicine

## 2023-02-28 NOTE — Telephone Encounter (Signed)
Pt advised refill sent. Appt scheduled for 3/8

## 2023-02-28 NOTE — Telephone Encounter (Signed)
#  60 sent. Due for follow-up.

## 2023-02-28 NOTE — Telephone Encounter (Signed)
Requesting: lorazepam Contract: n/a UDS: n/a Last Visit: 08/25/22 Next Visit: 6 mo f/u not sched Last Refill: 08/25/22 (60,3)  Please Advise. Med pending

## 2023-03-02 ENCOUNTER — Ambulatory Visit (INDEPENDENT_AMBULATORY_CARE_PROVIDER_SITE_OTHER): Payer: No Typology Code available for payment source | Admitting: Family Medicine

## 2023-03-02 ENCOUNTER — Encounter: Payer: Self-pay | Admitting: Family Medicine

## 2023-03-02 VITALS — BP 129/91 | HR 71 | Temp 97.8°F | Ht 65.0 in | Wt 154.4 lb

## 2023-03-02 DIAGNOSIS — Z79899 Other long term (current) drug therapy: Secondary | ICD-10-CM | POA: Diagnosis not present

## 2023-03-02 DIAGNOSIS — F3342 Major depressive disorder, recurrent, in full remission: Secondary | ICD-10-CM | POA: Diagnosis not present

## 2023-03-02 DIAGNOSIS — F5104 Psychophysiologic insomnia: Secondary | ICD-10-CM

## 2023-03-02 DIAGNOSIS — F411 Generalized anxiety disorder: Secondary | ICD-10-CM

## 2023-03-02 MED ORDER — LORAZEPAM 0.5 MG PO TABS: ORAL_TABLET | ORAL | 5 refills | Status: DC

## 2023-03-02 NOTE — Progress Notes (Signed)
OFFICE VISIT  03/02/2023  CC:  Chief Complaint  Patient presents with   Medical Management of Chronic Issues    Patient is a 47 y.o. male who presents for 81-monthfollow-up major depressive disorder and anxiety. A/P as of last visit: "Major depressive disorder, in remission. General anxiety disorder, well controlled. Anxiety-related insomnia, well controlled. Continue sertraline 150 mg a day and lorazepam 0.5 mg, 1-2 nightly as needed. Prescriptions refilled today."  INTERIM HX: Doing pretty well. Sees a behavioral therapist and feels like this is very helpful. Wife's brain cancer remains in remission.  Unfortunately she recently had a focal seizure but has recovered and is otherwise well. 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 He works full-time, long hours. Overall his mood is stable, anxiety level stable.  Takes lorazepam nightly 1-2 tabs, rare dose in the daytime. Taking sertraline 150 mg daily.  PMP AWARE reviewed today: most recent rx for lorazepam 0.5 mg was filled 02/28/2023, # [id number] rx by me. No red flags.  Past Medical History:  Diagnosis Date   Acute colitis 08/2019   CT.  Presumed infectious.   Allergy    Asthma    childhood only.   GAD (generalized anxiety disorder)    Hypercholesterolemia 01/2019   Mild: 2020->10 yr framingham risk= 2.5%.  02/2020 Frm risk= 3%.-->TLC. 04/2022 3.2%.   OCD (obsessive compulsive disorder)    tendencies.  ADD sx's as well.   Seasonal allergies    Vitamin D deficiency     Past Surgical History:  Procedure Laterality Date   CARDIOVASCULAR STRESS TEST  12/2017   ETT--NORMAL   COLONOSCOPY W/ POLYPECTOMY     05/2022 adenoma x1, recall 7 yrs   VASECTOMY     vasectomy reverasal      Outpatient Medications Prior to Visit  Medication Sig Dispense Refill   gabapentin (NEURONTIN) 100 MG capsule Take 2 capsules (200 mg total) by mouth at bedtime. 180 capsule 0   predniSONE (DELTASONE) 20 MG  tablet Take 2 tablets (40 mg total) by mouth daily with breakfast. 10 tablet 0   sertraline (ZOLOFT) 100 MG tablet Take 1 tablet (100 mg total) by mouth daily. Take with 50 mg sertraline daily. 90 tablet 3   sertraline (ZOLOFT) 50 MG tablet Take 1 tablet (50 mg total) by mouth daily. 90 tablet 3   nystatin (MYCOSTATIN) 100000 UNIT/ML suspension SWISH AND SPIT 5 MLS 4 (FOUR) TIMES DAILY AS NEEDED FOR MOUTH PAIN. (Patient not taking: Reported on 03/02/2023) 120 mL 1   LORazepam (ATIVAN) 0.5 MG tablet TAKE 1 TO 2 TABLETS BY MOUTH TWICE A DAY AS NEEDED FOR ANXIETY 60 tablet 0   No facility-administered medications prior to visit.    No Known Allergies  Review of Systems As per HPI  PE:    03/02/2023    2:50 PM 02/27/2023    1:02 PM 01/25/2023    2:28 PM  Vitals with BMI  Height '5\' 5"'$  '5\' 5"'$  '5\' 5"'$   Weight 154 lbs 6 oz 155 lbs 153 lbs  BMI 25.69 [id number] Systolic [id number] Diastolic 91 78 82  Pulse 71 67    Physical Exam  Gen: Alert, well appearing.  Patient is oriented to person, place, time, and situation. AFFECT: pleasant, lucid thought and speech. No further exam today  LABS:  none  IMPRESSION AND PLAN:  Major depressive disorder, in remission. General anxiety disorder, well controlled. Anxiety-related insomnia, well controlled. Continue sertraline  150 mg a day and lorazepam 0.5 mg, 1-2 nightly as needed. Prescriptions refilled today. Continue counseling.  An After Visit Summary was printed and given to the patient.  FOLLOW UP: Return in about 6 months (around 09/02/2023) for annual CPE (fasting).  Signed:  Crissie Sickles, MD           03/02/2023

## 2023-03-04 LAB — DRUG MONITORING PANEL 376104, URINE
Alphahydroxyalprazolam: NEGATIVE ng/mL (ref <25)
Alphahydroxymidazolam: NEGATIVE ng/mL (ref <50)
Alphahydroxytriazolam: NEGATIVE ng/mL (ref <50)
Aminoclonazepam: NEGATIVE ng/mL (ref <25)
Amphetamines: NEGATIVE ng/mL (ref <500)
Barbiturates: NEGATIVE ng/mL (ref <300)
Benzodiazepines: POSITIVE ng/mL — AB (ref <100)
Cocaine Metabolite: NEGATIVE ng/mL (ref <150)
Desmethyltramadol: NEGATIVE ng/mL (ref <100)
Hydroxyethylflurazepam: NEGATIVE ng/mL (ref <50)
Lorazepam: 104 ng/mL — ABNORMAL HIGH (ref <50)
Nordiazepam: NEGATIVE ng/mL (ref <50)
Opiates: NEGATIVE ng/mL (ref <100)
Oxazepam: NEGATIVE ng/mL (ref <50)
Oxycodone: NEGATIVE ng/mL (ref <100)
Temazepam: NEGATIVE ng/mL (ref <50)
Tramadol: NEGATIVE ng/mL (ref <100)

## 2023-03-04 LAB — DM TEMPLATE

## 2023-03-16 ENCOUNTER — Encounter: Payer: No Typology Code available for payment source | Admitting: Family Medicine

## 2023-03-29 ENCOUNTER — Ambulatory Visit: Payer: No Typology Code available for payment source | Admitting: Family Medicine

## 2023-04-26 ENCOUNTER — Other Ambulatory Visit: Payer: Self-pay

## 2023-04-26 ENCOUNTER — Ambulatory Visit (INDEPENDENT_AMBULATORY_CARE_PROVIDER_SITE_OTHER): Payer: No Typology Code available for payment source | Admitting: Family Medicine

## 2023-04-26 ENCOUNTER — Encounter: Payer: Self-pay | Admitting: Family Medicine

## 2023-04-26 VITALS — BP 112/76 | HR 95 | Ht 65.0 in | Wt 152.0 lb

## 2023-04-26 DIAGNOSIS — M79672 Pain in left foot: Secondary | ICD-10-CM

## 2023-04-26 DIAGNOSIS — M84375A Stress fracture, left foot, initial encounter for fracture: Secondary | ICD-10-CM

## 2023-04-26 MED ORDER — MELOXICAM 15 MG PO TABS: 15.0000 mg | ORAL_TABLET | Freq: Every day | ORAL | 0 refills | Status: DC

## 2023-04-26 MED ORDER — VITAMIN D (ERGOCALCIFEROL) 1.25 MG (50000 UNIT) PO CAPS: 50000.0000 [IU] | ORAL_CAPSULE | ORAL | 0 refills | Status: DC

## 2023-04-26 NOTE — Patient Instructions (Addendum)
Weekly Vit D prescribed Carbon fiber plate Rigid sole shoes K2 daily Meloxicam 15mg  daily See you again in 4 weeks

## 2023-04-26 NOTE — Progress Notes (Signed)
[name] [address] [place] [address] Phone: [phone] Subjective:    I'm seeing this patient by the request  of:  [name], Maryjean Morn, MD  CC: Foot pain  [id number]  Last seen in March 2024 for OMT  updated 04/26/2023 Dennis [name] is a 47 y.o. male coming in with complaint of foot pain. L foot pain that started within the last week. Sharp pain with palpation. Painful when walking. Can't recall any numbness or tingling. At rest foot still aches, but doesn't wake him at night.       Past Medical History:  Diagnosis Date   Acute colitis 08/2019   CT.  Presumed infectious.   Allergy    Asthma    childhood only.   GAD (generalized anxiety disorder)    Hypercholesterolemia 01/2019   Mild: 2020->10 yr framingham risk= 2.5%.  02/2020 Frm risk= 3%.-->TLC. 04/2022 3.2%.   OCD (obsessive compulsive disorder)    tendencies.  ADD sx's as well.   Seasonal allergies    Vitamin D deficiency    Past Surgical History:  Procedure Laterality Date   CARDIOVASCULAR STRESS TEST  12/2017   ETT--NORMAL   COLONOSCOPY W/ POLYPECTOMY     05/2022 adenoma x1, recall 7 yrs   VASECTOMY     vasectomy reverasal     Social History   Socioeconomic History   Marital status: Married    Spouse name: Not on file   Number of children: Not on file   Years of education: Not on file   Highest education level: Not on file  Occupational History   Not on file  Tobacco Use   Smoking status: Never   Smokeless tobacco: Never  Vaping Use   Vaping Use: Never used  Substance and Sexual Activity   Alcohol use: No   Drug use: No   Sexual activity: Not on file  Other Topics Concern   Not on file  Social History Narrative   Married, 3 daughters and 1 son.  Pt w/out siblings.   Relocated to [place] from [place] 2018.   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   No T/A/Ds.   Social Determinants of Health   Financial  Resource Strain: Not on file  Food Insecurity: Not on file  Transportation Needs: Not on file  Physical Activity: Not on file  Stress: Not on file  Social Connections: Not on file   No Known Allergies Family History  Problem Relation Age of Onset   Cancer Mother    Depression Mother    Stroke Mother    Stroke Father    Hypertension Father    Early death Father    Crohn's disease Paternal Aunt    Colon cancer Maternal Grandmother    Arthritis Maternal Grandmother    Cancer Maternal Grandmother    Early death Maternal Grandmother    Alcohol abuse Maternal Grandfather    Heart disease Maternal Grandfather    Stroke Maternal Grandfather    Cancer Paternal Grandfather    COPD Paternal Grandfather    Esophageal cancer Neg Hx    Rectal cancer Neg Hx    Stomach cancer Neg Hx     Current Outpatient Medications (Endocrine & Metabolic):    predniSONE (DELTASONE) 20 MG tablet, Take 2 tablets (40 mg total) by mouth daily with breakfast.    Current Outpatient Medications (Analgesics):    meloxicam (MOBIC) 15 MG tablet, Take 1 tablet (15 mg total) by  mouth daily.   Current Outpatient Medications (Other):    Vitamin D, Ergocalciferol, (DRISDOL) 1.25 MG (50000 UNIT) CAPS capsule, Take 1 capsule (50,000 Units total) by mouth every 7 (seven) days.   gabapentin (NEURONTIN) 100 MG capsule, Take 2 capsules (200 mg total) by mouth at bedtime.   LORazepam (ATIVAN) 0.5 MG tablet, TAKE 1 TO 2 TABLETS BY MOUTH TWICE A DAY AS NEEDED FOR ANXIETY   nystatin (MYCOSTATIN) 100000 UNIT/ML suspension, SWISH AND SPIT 5 MLS 4 (FOUR) TIMES DAILY AS NEEDED FOR MOUTH PAIN. (Patient not taking: Reported on 03/02/2023)   sertraline (ZOLOFT) 100 MG tablet, Take 1 tablet (100 mg total) by mouth daily. Take with 50 mg sertraline daily.   sertraline (ZOLOFT) 50 MG tablet, Take 1 tablet (50 mg total) by mouth daily.   Reviewed prior external information including notes and imaging from  primary care provider As  well as notes that were available from care everywhere and other healthcare systems.  Past medical history, social, surgical and family history all reviewed in electronic medical record.  No pertanent information unless stated regarding to the chief complaint.   Review of Systems:  No headache, visual changes, nausea, vomiting, diarrhea, constipation, dizziness, abdominal pain, skin rash, fevers, chills, night sweats, weight loss, swollen lymph nodes, body aches, chest pain, shortness of breath, mood changes. POSITIVE muscle aches, joint swelling  Objective  Blood pressure 112/76, pulse 95, height 5\' 5"  (1.651 m), weight 152 lb (68.9 kg), SpO2 97 %.   General: No apparent distress alert and oriented x3 mood and affect normal, dressed appropriately.  HEENT: Pupils equal, extraocular movements intact  Respiratory: Patient's speak in full sentences and does not appear short of breath  Cardiovascular: No lower extremity edema, non tender, no erythema  Foot exam shows the patient does have tenderness to palpation over the third metatarsal.  This seems to be on the dorsal aspect of the foot.  No significant swelling noted.  Limited muscular skeletal ultrasound was performed and interpreted by Antoine Primas, M  Limited ultrasound does show the patient does have a cortical irregularity noted of the third metatarsal.  Hypoechoic changes and increasing neovascularization in the area.  No masses appreciated. Impression: Third metatarsal stress fracture versus true fracture    Impression and Recommendations:     The above documentation has been reviewed and is accurate and complete Judi Saa, DO

## 2023-04-26 NOTE — Assessment & Plan Note (Signed)
Patient does have a fracture with a cortical irregularity noted of the third metatarsal.  I do see some abnormal neovascularization in the area that is likely stabilizing acute fracture.  We discussed rigid soled shoe, patient declined boot, once weekly vitamin D, icing regimen.  Follow-up with me again in 3 to 4 weeks to make sure callus formation and progress accordingly.

## 2023-05-02 ENCOUNTER — Telehealth: Payer: Self-pay | Admitting: Family Medicine

## 2023-05-02 ENCOUNTER — Ambulatory Visit: Payer: No Typology Code available for payment source | Admitting: Family Medicine

## 2023-05-02 NOTE — Telephone Encounter (Signed)
Patient needs refill on LORazepam (ATIVAN) 0.5 MG tablet  sent to [facility]

## 2023-05-02 NOTE — Telephone Encounter (Signed)
LVM for pt regarding refill. Last refill sent 3/8, #30 RF x5.

## 2023-05-23 NOTE — Progress Notes (Deleted)
  [name] [address] [place] [address] Phone: [phone] Subjective:    I'm seeing this patient by the request  of:  [name], Maryjean Morn, MD  CC:   [id number]  04/26/2023 Patient does have a fracture with a cortical irregularity noted of the third metatarsal.  I do see some abnormal neovascularization in the area that is likely stabilizing acute fracture.  We discussed rigid soled shoe, patient declined boot, once weekly vitamin D, icing regimen.  Follow-up with me again in 3 to 4 weeks to make sure callus formation and progress accordingly   Update 05/25/2023 Dennis [name] is a 47 y.o. male coming in with complaint of back and neck pain. Also f/u for L foot fx. Patient states   Medications patient has been prescribed:   Taking:         Reviewed prior external information including notes and imaging from previsou exam, outside providers and external EMR if available.   As well as notes that were available from care everywhere and other healthcare systems.  Past medical history, social, surgical and family history all reviewed in electronic medical record.  No pertanent information unless stated regarding to the chief complaint.   Past Medical History:  Diagnosis Date   Acute colitis 08/2019   CT.  Presumed infectious.   Allergy    Asthma    childhood only.   GAD (generalized anxiety disorder)    Hypercholesterolemia 01/2019   Mild: 2020->10 yr framingham risk= 2.5%.  02/2020 Frm risk= 3%.-->TLC. 04/2022 3.2%.   OCD (obsessive compulsive disorder)    tendencies.  ADD sx's as well.   Seasonal allergies    Vitamin D deficiency     No Known Allergies   Review of Systems:  No headache, visual changes, nausea, vomiting, diarrhea, constipation, dizziness, abdominal pain, skin rash, fevers, chills, night sweats, weight loss, swollen lymph nodes, body aches, joint swelling, chest pain, shortness of breath, mood changes. POSITIVE  muscle aches  Objective  There were no vitals taken for this visit.   General: No apparent distress alert and oriented x3 mood and affect normal, dressed appropriately.  HEENT: Pupils equal, extraocular movements intact  Respiratory: Patient's speak in full sentences and does not appear short of breath  Cardiovascular: No lower extremity edema, non tender, no erythema  Gait MSK:  Back   Osteopathic findings  C2 flexed rotated and side bent right C6 flexed rotated and side bent left T3 extended rotated and side bent right inhaled rib T9 extended rotated and side bent left L2 flexed rotated and side bent right Sacrum right on right       Assessment and Plan:  No problem-specific Assessment & Plan notes found for this encounter.    Nonallopathic problems  Decision today to treat with OMT was based on Physical Exam  After verbal consent patient was treated with HVLA, ME, FPR techniques in cervical, rib, thoracic, lumbar, and sacral  areas  Patient tolerated the procedure well with improvement in symptoms  Patient given exercises, stretches and lifestyle modifications  See medications in patient instructions if given  Patient will follow up in 4-8 weeks             Note: This dictation was prepared with Dragon dictation along with smaller phrase technology. Any transcriptional errors that result from this process are unintentional.

## 2023-05-25 ENCOUNTER — Ambulatory Visit: Payer: Self-pay | Admitting: Family Medicine

## 2023-08-22 ENCOUNTER — Other Ambulatory Visit: Payer: Self-pay | Admitting: Family Medicine

## 2023-08-29 ENCOUNTER — Other Ambulatory Visit: Payer: Self-pay | Admitting: Family Medicine

## 2023-08-29 ENCOUNTER — Telehealth: Payer: Self-pay | Admitting: Family Medicine

## 2023-08-29 NOTE — Telephone Encounter (Signed)
Patient is scheduled for 9/12 at 2:40, however he is requesting a temp refill on sertraline (ZOLOFT) 100 MG tablet   sertraline (ZOLOFT) 50 MG tablet

## 2023-08-31 ENCOUNTER — Other Ambulatory Visit: Payer: Self-pay

## 2023-08-31 MED ORDER — SERTRALINE HCL 100 MG PO TABS: 100.0000 mg | ORAL_TABLET | Freq: Every day | ORAL | 0 refills | Status: DC

## 2023-08-31 MED ORDER — SERTRALINE HCL 50 MG PO TABS: 50.0000 mg | ORAL_TABLET | Freq: Every day | ORAL | 0 refills | Status: DC

## 2023-09-05 NOTE — Patient Instructions (Incomplete)

## 2023-09-06 ENCOUNTER — Ambulatory Visit: Payer: No Typology Code available for payment source | Admitting: Family Medicine

## 2023-09-06 ENCOUNTER — Encounter: Payer: Self-pay | Admitting: Family Medicine

## 2023-09-06 VITALS — BP 127/82 | HR 70 | Temp 97.3°F | Ht 63.75 in | Wt 153.6 lb

## 2023-09-06 DIAGNOSIS — E7800 Pure hypercholesterolemia, unspecified: Secondary | ICD-10-CM

## 2023-09-06 DIAGNOSIS — Z79899 Other long term (current) drug therapy: Secondary | ICD-10-CM

## 2023-09-06 DIAGNOSIS — F411 Generalized anxiety disorder: Secondary | ICD-10-CM

## 2023-09-06 DIAGNOSIS — Z0000 Encounter for general adult medical examination without abnormal findings: Secondary | ICD-10-CM | POA: Diagnosis not present

## 2023-09-06 DIAGNOSIS — F5104 Psychophysiologic insomnia: Secondary | ICD-10-CM

## 2023-09-06 DIAGNOSIS — Z1159 Encounter for screening for other viral diseases: Secondary | ICD-10-CM

## 2023-09-06 DIAGNOSIS — Z23 Encounter for immunization: Secondary | ICD-10-CM

## 2023-09-06 MED ORDER — CEPHALEXIN 500 MG PO CAPS: ORAL_CAPSULE | ORAL | 0 refills | Status: DC

## 2023-09-06 NOTE — Progress Notes (Signed)
Office Note 09/06/2023  CC:  Chief Complaint  Patient presents with   Annual Exam    Pt is not fasting   Patient is a 47 y.o. male who is here for annual health maintenance exam and 33-month follow-up generalized anxiety. A/P as of last visit: "Major depressive disorder, in remission. General anxiety disorder, well controlled. Anxiety-related insomnia, well controlled. Continue sertraline 150 mg a day and lorazepam 0.5 mg, 1-2 nightly as needed. Prescriptions refilled today. Continue counseling."  INTERIM HX: Dennis [name] has good days and bad days lately.  He got laid off in April.   He finds himself pretty easily irritated many days.  Other days when the job search is going better he feels great. Mood fluctuate as well.    PMP AWARE reviewed today: most recent rx for lorazepam 0.5 mg was filled 08/29/2023, # 60, rx by me. No red flags.  Past Medical History:  Diagnosis Date   Acute colitis 08/2019   CT.  Presumed infectious.   Allergy    Asthma    childhood only.   GAD (generalized anxiety disorder)    Hypercholesterolemia 01/2019   Mild: 2020->10 yr framingham risk= 2.5%.  02/2020 Frm risk= 3%.-->TLC. 04/2022 3.2%.   OCD (obsessive compulsive disorder)    tendencies.  ADD sx's as well.   Seasonal allergies    Vitamin D deficiency     Past Surgical History:  Procedure Laterality Date   CARDIOVASCULAR STRESS TEST  12/2017   ETT--NORMAL   COLONOSCOPY W/ POLYPECTOMY     05/2022 adenoma x1, recall 7 yrs   VASECTOMY     vasectomy reverasal      Family History  Problem Relation Age of Onset   Cancer Mother    Depression Mother    Stroke Mother    Stroke Father    Hypertension Father    Early death Father    Crohn's disease Paternal Aunt    Colon cancer Maternal Grandmother    Arthritis Maternal Grandmother    Cancer Maternal Grandmother    Early death Maternal Grandmother    Alcohol abuse Maternal Grandfather    Heart disease Maternal Grandfather    Stroke  Maternal Grandfather    Cancer Paternal Grandfather    COPD Paternal Grandfather    Esophageal cancer Neg Hx    Rectal cancer Neg Hx    Stomach cancer Neg Hx     Social History   Socioeconomic History   Marital status: Married    Spouse name: Not on file   Number of children: Not on file   Years of education: Not on file   Highest education level: Not on file  Occupational History   Not on file  Tobacco Use   Smoking status: Never   Smokeless tobacco: Never  Vaping Use   Vaping status: Never Used  Substance and Sexual Activity   Alcohol use: No   Drug use: No   Sexual activity: Not on file  Other Topics Concern   Not on file  Social History Narrative   Married, 3 daughters and 1 son.  Pt w/out siblings.   Relocated to [place] from [place] 2018.   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   No T/A/Ds.   Social Determinants of Health   Financial Resource Strain: Not on file  Food Insecurity: Not on file  Transportation Needs: Not on file  Physical Activity: Not on file  Stress: Not on file  Social Connections: Not on file  Intimate Partner Violence: Not on file    Outpatient Medications Prior to Visit  Medication Sig Dispense Refill   sertraline (ZOLOFT) 100 MG tablet Take 1 tablet (100 mg total) by mouth daily. Take with 50 mg sertraline daily. 30 tablet 0   sertraline (ZOLOFT) 50 MG tablet Take 1 tablet (50 mg total) by mouth daily. 30 tablet 0   LORazepam (ATIVAN) 0.5 MG tablet TAKE 1 TO 2 TABLETS BY MOUTH TWICE A DAY AS NEEDED FOR ANXIETY (Patient not taking: Reported on 09/06/2023) 60 tablet 5   gabapentin (NEURONTIN) 100 MG capsule Take 2 capsules (200 mg total) by mouth at bedtime. (Patient not taking: Reported on 09/06/2023) 180 capsule 0   meloxicam (MOBIC) 15 MG tablet Take 1 tablet (15 mg total) by mouth daily. (Patient not taking: Reported on 09/06/2023) 30 tablet 0   nystatin (MYCOSTATIN) 100000 UNIT/ML suspension SWISH  AND SPIT 5 MLS 4 (FOUR) TIMES DAILY AS NEEDED FOR MOUTH PAIN. (Patient not taking: Reported on 03/02/2023) 120 mL 1   predniSONE (DELTASONE) 20 MG tablet Take 2 tablets (40 mg total) by mouth daily with breakfast. (Patient not taking: Reported on 09/06/2023) 10 tablet 0   Vitamin D, Ergocalciferol, (DRISDOL) 1.25 MG (50000 UNIT) CAPS capsule Take 1 capsule (50,000 Units total) by mouth every 7 (seven) days. (Patient not taking: Reported on 09/06/2023) 12 capsule 0   No facility-administered medications prior to visit.    No Known Allergies  Review of Systems  Constitutional:  Negative for appetite change, chills, fatigue and fever.  HENT:  Negative for congestion, dental problem, ear pain and sore throat.   Eyes:  Negative for discharge, redness and visual disturbance.  Respiratory:  Negative for cough, chest tightness, shortness of breath and wheezing.   Cardiovascular:  Negative for chest pain, palpitations and leg swelling.  Gastrointestinal:  Negative for abdominal pain, blood in stool, diarrhea, nausea and vomiting.  Genitourinary:  Negative for difficulty urinating, dysuria, flank pain, frequency, hematuria and urgency.  Musculoskeletal:  Negative for arthralgias, back pain, joint swelling, myalgias and neck stiffness.  Skin:  Negative for pallor and rash.  Neurological:  Negative for dizziness, speech difficulty, weakness and headaches.  Hematological:  Negative for adenopathy. Does not bruise/bleed easily.  Psychiatric/Behavioral:  Negative for confusion and sleep disturbance. The patient is not nervous/anxious.     PE;    09/06/2023    2:47 PM 04/26/2023    8:03 AM 03/02/2023    2:50 PM  Vitals with BMI  Height 5' 3.75" 5\' 5"  5\' 5"   Weight 153 lbs 10 oz 152 lbs 154 lbs 6 oz  BMI 26.58 25.29 25.69  Systolic 127 112 578  Diastolic 82 76 91  Pulse 70 95 71    Gen: Alert, well appearing.  Patient is oriented to person, place, time, and situation. AFFECT: pleasant, lucid thought  and speech. ENT: Ears: EACs clear, normal epithelium.  TMs with good light reflex and landmarks bilaterally.  Eyes: no injection, icteris, swelling, or exudate.  EOMI, PERRLA. Nose: no drainage or turbinate edema/swelling.  No injection or focal lesion.  Mouth: lips without lesion/swelling.  Oral mucosa pink and moist.  Dentition intact and without obvious caries or gingival swelling.  Oropharynx without erythema, exudate, or swelling.  Neck: supple/nontender.  No LAD, mass, or TM.  Carotid pulses 2+ bilaterally, without bruits. CV: RRR, no m/r/g.   LUNGS: CTA bilat, nonlabored resps, good aeration in all lung fields. ABD: soft, NT, ND, BS normal.  No hepatospenomegaly  or mass.  No bruits. EXT: no clubbing, cyanosis, or edema.  Musculoskeletal: no joint swelling, erythema, warmth, or tenderness.  ROM of all joints intact. Skin - no sores or suspicious lesions or rashes or color changes  Pertinent labs:  Lab Results  Component Value Date   TSH 1.38 05/10/2022   Lab Results  Component Value Date   WBC 4.1 05/10/2022   HGB 15.7 05/10/2022   HCT 47.3 05/10/2022   MCV 92.0 05/10/2022   PLT 211 05/10/2022   Lab Results  Component Value Date   CREATININE 0.95 05/10/2022   BUN 15 05/10/2022   NA 140 05/10/2022   K 4.3 05/10/2022   CL 105 05/10/2022   CO2 24 05/10/2022   Lab Results  Component Value Date   ALT 16 05/10/2022   AST 20 05/10/2022   ALKPHOS 46 08/31/2019   BILITOT 0.5 05/10/2022   Lab Results  Component Value Date   CHOL 190 05/10/2022   Lab Results  Component Value Date   HDL 32 (L) 05/10/2022   Lab Results  Component Value Date   LDLCALC 136 (H) 05/10/2022   Lab Results  Component Value Date   TRIG 116 05/10/2022   Lab Results  Component Value Date   CHOLHDL 5.9 (H) 05/10/2022   ASSESSMENT AND PLAN:   #1 health maintenance exam: Reviewed age and gender appropriate health maintenance issues (prudent diet, regular exercise, health risks of tobacco  and excessive alcohol, use of seatbelts, fire alarms in home, use of sunscreen).  Also reviewed age and gender appropriate health screening as well as vaccine recommendations. Vaccines: declined flu.  Tdap is up-to-date. Labs: Health panel labs ordered--> Future when fasting. Prostate ca screening: average risk patient= as per latest guidelines, start screening at 6 yrs of age. Colon ca screening: Recall 2030  #2 generalized anxiety disorder with OCD tendencies. He is going through a rough patch lately since being laid off April 2024. He would prefer to wean off of the sertraline.  Recommended 100 mg a day x 7 days and then 50 mg a day x 7 days then stop.  #3 insomnia due to excessive anxiety. Okay to continue lorazepam 0.5, 1-2 nightly. A prescription was not needed today.  4.  Skin picking/tic/nervous habit behavior. These are possibly superinfected with staph so will prescribe Keflex 500 3 times daily x 10 days.  #5 ADHD combined type. Unfortunately, treating with a stimulant would likely make his anxiety, insomnia, and picking/tic behavior worsen. No meds at this time.  An After Visit Summary was printed and given to the patient.  FOLLOW UP:  Return in about 6 months (around 03/05/2024) for annual CPE (fasting).  Signed:  Santiago Bumpers, MD           09/06/2023

## 2023-09-13 ENCOUNTER — Other Ambulatory Visit: Payer: No Typology Code available for payment source

## 2023-09-13 DIAGNOSIS — E7800 Pure hypercholesterolemia, unspecified: Secondary | ICD-10-CM

## 2023-09-13 DIAGNOSIS — Z0000 Encounter for general adult medical examination without abnormal findings: Secondary | ICD-10-CM

## 2023-09-14 ENCOUNTER — Encounter: Payer: Self-pay | Admitting: Family Medicine

## 2023-09-14 DIAGNOSIS — E782 Mixed hyperlipidemia: Secondary | ICD-10-CM

## 2023-09-14 DIAGNOSIS — Z9189 Other specified personal risk factors, not elsewhere classified: Secondary | ICD-10-CM

## 2023-09-14 LAB — CBC WITH DIFFERENTIAL/PLATELET
Absolute Monocytes: 546 cells/uL (ref 200–950)
Basophils Absolute: 42 cells/uL (ref 0–200)
Basophils Relative: 0.8 %
Eosinophils Absolute: 270 cells/uL (ref 15–500)
Eosinophils Relative: 5.1 %
HCT: 51.4 % — ABNORMAL HIGH (ref 38.5–50.0)
Hemoglobin: 16.7 g/dL (ref 13.2–17.1)
Lymphs Abs: 1728 cells/uL (ref 850–3900)
MCH: 30.3 pg (ref 27.0–33.0)
MCHC: 32.5 g/dL (ref 32.0–36.0)
MCV: 93.1 fL (ref 80.0–100.0)
MPV: 9.6 fL (ref 7.5–12.5)
Monocytes Relative: 10.3 %
Neutro Abs: 2714 cells/uL (ref 1500–7800)
Neutrophils Relative %: 51.2 %
Platelets: 247 10*3/uL (ref 140–400)
RBC: 5.52 10*6/uL (ref 4.20–5.80)
RDW: 12.4 % (ref 11.0–15.0)
Total Lymphocyte: 32.6 %
WBC: 5.3 10*3/uL (ref 3.8–10.8)

## 2023-09-14 LAB — LIPID PANEL
Cholesterol: 254 mg/dL — ABNORMAL HIGH (ref <200)
HDL: 36 mg/dL — ABNORMAL LOW (ref >=40)
LDL Cholesterol (Calc): 176 mg/dL (calc) — ABNORMAL HIGH
Non-HDL Cholesterol (Calc): 218 mg/dL (calc) — ABNORMAL HIGH (ref <130)
Total CHOL/HDL Ratio: 7.1 (calc) — ABNORMAL HIGH (ref <5.0)
Triglycerides: 259 mg/dL — ABNORMAL HIGH (ref <150)

## 2023-09-14 LAB — COMPREHENSIVE METABOLIC PANEL
AG Ratio: 1.8 (calc) (ref 1.0–2.5)
ALT: 14 U/L (ref 9–46)
AST: 16 U/L (ref 10–40)
Albumin: 4.4 g/dL (ref 3.6–5.1)
Alkaline phosphatase (APISO): 52 U/L (ref 36–130)
BUN: 17 mg/dL (ref 7–25)
CO2: 26 mmol/L (ref 20–32)
Calcium: 9.9 mg/dL (ref 8.6–10.3)
Chloride: 102 mmol/L (ref 98–110)
Creat: 0.99 mg/dL (ref 0.60–1.29)
Globulin: 2.5 g/dL (calc) (ref 1.9–3.7)
Glucose, Bld: 98 mg/dL (ref 65–99)
Potassium: 4.6 mmol/L (ref 3.5–5.3)
Sodium: 140 mmol/L (ref 135–146)
Total Bilirubin: 0.5 mg/dL (ref 0.2–1.2)
Total Protein: 6.9 g/dL (ref 6.1–8.1)

## 2023-09-14 LAB — TSH: TSH: 1.73 mIU/L (ref 0.40–4.50)

## 2023-09-14 NOTE — Telephone Encounter (Signed)
I recommend a coronary calcium score.  I will order the test and you will get called to schedule. --PM

## 2023-09-14 NOTE — Telephone Encounter (Signed)
Your LDL cholesterol is significantly elevated.  (Triglycerides are moderately elevated as well but these are not very predictive of heart disease and treatment with medication to reduce this is only indicated when they are >400 or so). Using a risk calculator you have intermediate risk of coronary disease over the next 10 years. In this case a cholesterol-lowering medication ("statin") is indicated. However, if you are hesitant about starting this medication we could get an additional test that can give [place] a better idea of your cardiovascular risk.  It is called a coronary calcium score (an imaging test).  A low score on this test could help with our decision to start a statin or not. Let me know if you would like to start the medication or would you rather do the calcium score first. --PM

## 2023-09-25 ENCOUNTER — Other Ambulatory Visit: Payer: Self-pay | Admitting: Family Medicine

## 2023-10-01 ENCOUNTER — Ambulatory Visit (HOSPITAL_BASED_OUTPATIENT_CLINIC_OR_DEPARTMENT_OTHER)
Admission: RE | Admit: 2023-10-01 | Discharge: 2023-10-01 | Disposition: A | Discharge status: Home / Self Care | Payer: No Typology Code available for payment source | Source: Ambulatory Visit | Attending: Family Medicine | Admitting: Family Medicine

## 2023-10-01 DIAGNOSIS — E782 Mixed hyperlipidemia: Secondary | ICD-10-CM | POA: Insufficient documentation

## 2023-10-01 DIAGNOSIS — Z9189 Other specified personal risk factors, not elsewhere classified: Secondary | ICD-10-CM | POA: Insufficient documentation

## 2023-10-11 ENCOUNTER — Other Ambulatory Visit: Payer: Self-pay | Admitting: Family Medicine

## 2023-10-12 ENCOUNTER — Other Ambulatory Visit: Payer: Self-pay | Admitting: Family Medicine

## 2023-10-15 ENCOUNTER — Telehealth: Payer: Self-pay | Admitting: Family Medicine

## 2023-10-15 ENCOUNTER — Other Ambulatory Visit: Payer: Self-pay | Admitting: Family Medicine

## 2023-10-15 ENCOUNTER — Encounter: Payer: Self-pay | Admitting: Family Medicine

## 2023-10-15 MED ORDER — SERTRALINE HCL 50 MG PO TABS: 50.0000 mg | ORAL_TABLET | Freq: Every day | ORAL | 0 refills | Status: DC

## 2023-10-15 MED ORDER — SERTRALINE HCL 100 MG PO TABS: 100.0000 mg | ORAL_TABLET | Freq: Every day | ORAL | 0 refills | Status: DC

## 2023-10-15 NOTE — Telephone Encounter (Signed)
Message sent to PCP regarding concern of refill.

## 2023-10-15 NOTE — Telephone Encounter (Signed)
Yes ok 

## 2023-10-15 NOTE — Telephone Encounter (Signed)
Patient requests TOC from Dr. Milinda Cave to Dr. Jon Billings. Please advise.

## 2023-10-15 NOTE — Telephone Encounter (Signed)
Patient called and reports that he needs a new prescription for Sertraline /Zoloft. Current one is expired. It is not on his current medications, but he states he has been out of medication for 2 days now. Pharmacy confirmed as [facility] [address].

## 2023-10-15 NOTE — Telephone Encounter (Signed)
FYI: I called patient and told him that I would give him a 30-day supply of his sertraline and I was discharging him from the practice effective immediately. See documented telephone interaction with nurse/front staff from earlier today. I told him I will not tolerate such rude behavior towards my staff. I encouraged him to call and talk to my office manager if he wanted to voice further complaints. I sent in #30 of sertraline 100 mg tabs and #30 of sertraline 50mg  tabs. He has already put in a request to transfer to Dr. Jon Billings.  This is ok with me. Signed:  Santiago Bumpers, MD           10/15/2023

## 2023-10-15 NOTE — Telephone Encounter (Signed)
Pt was last seen 9/12 for CPE, it was discussed during appt;  "He would prefer to wean off of the sertraline. Recommended 100 mg a day x 7 days and then 50 mg a day x 7 days then stop. "   Please advise regarding refill of 50 and 100mg  doses.

## 2023-10-15 NOTE — Telephone Encounter (Signed)
Please disregard duplicate request. Approval for transfer documented.

## 2023-10-15 NOTE — Telephone Encounter (Signed)
Noted  

## 2023-10-15 NOTE — Telephone Encounter (Signed)
Patient calling regarding his refill on Sertraline/Zoloft. He said he spoke with someone this morning and he should have an answer by now. I told patient Dr. Milinda Cave has been in patient care and as soon as he can he will be called. Then he became very irate, and yelling."My vision is blurring and I am very dizzy, I have been withou my meds for 3 days". I told him I needed to transfer him to triage nurse and he said "no, I dont want to speak to her, I want to speak to someone who can tell me why my medication is not filled."  I said I am sorry, but with the symptoms you are having I can only transfer you to a triage nurse at this time. He said he called on Friday and spoke to someone, call is not documented. I asked him does he know who he spoke to, he said "No, yall do not give me your names".   I repeated my name to him so he could have for reference.  He said "now transfer me to someone that can help me." I told him that I could not do that. He said then I will be disconnecting call. I told him someone will be calling him back.  Please call him back as soon as someone can. I read the note from 9/12 but I did not think I should have given info to him. Patient was irate and yelling.

## 2023-10-15 NOTE — Telephone Encounter (Signed)
FYI. Please see below. Phone note sent to PCP regarding request. This is the attached note from call.  Gerald Stabs [facility]   10/15/23  1:45 PM Note Patient calling regarding his refill on Sertraline/Zoloft. He said he spoke with someone this morning and he should have an answer by now. I told patient Dr. Milinda Cave has been in patient care and as soon as he can he will be called. Then he became very irate, and yelling."My vision is blurring and I am very dizzy, I have been withou my meds for 3 days". I told him I needed to transfer him to triage nurse and he said "no, I dont want to speak to her, I want to speak to someone who can tell me why my medication is not filled."  I said I am sorry, but with the symptoms you are having I can only transfer you to a triage nurse at this time. He said he called on Friday and spoke to someone, call is not documented. I asked him does he know who he spoke to, he said "No, yall do not give me your names".   I repeated my name to him so he could have for reference.  He said "now transfer me to someone that can help me." I told him that I could not do that. He said then I will be disconnecting call. I told him someone will be calling him back.   Please call him back as soon as someone can. I read the note from 9/12 but I did not think I should have given info to him. Patient was irate and yelling.

## 2023-10-18 NOTE — Telephone Encounter (Signed)
LVM to schedule from Dr. Milinda Cave to Dr. Jon Billings

## 2023-10-20 ENCOUNTER — Other Ambulatory Visit: Payer: Self-pay | Admitting: Family Medicine

## 2023-10-22 ENCOUNTER — Telehealth: Payer: Self-pay | Admitting: Family Medicine

## 2023-10-22 NOTE — Telephone Encounter (Signed)
Please fill, if appropriate. TOC appt as of 11/05/23

## 2023-10-22 NOTE — Telephone Encounter (Signed)
Pt states he will be out of meds before his TOC but also states he does not feel comfortable in send a message for meds refill at old office. Please advise.

## 2023-10-23 NOTE — Telephone Encounter (Signed)
Left vm for patient to call the office back to schedule a sooner appt.

## 2023-11-02 ENCOUNTER — Encounter: Payer: Self-pay | Admitting: Internal Medicine

## 2023-11-05 ENCOUNTER — Encounter: Payer: No Typology Code available for payment source | Admitting: Internal Medicine

## 2023-11-05 NOTE — Telephone Encounter (Signed)
Patient is scheduled for an OV on 12/07/23.

## 2023-11-09 ENCOUNTER — Other Ambulatory Visit: Payer: Self-pay | Admitting: Family Medicine

## 2023-11-28 ENCOUNTER — Encounter: Payer: Self-pay | Admitting: Internal Medicine

## 2023-11-28 ENCOUNTER — Ambulatory Visit: Payer: No Typology Code available for payment source | Admitting: Internal Medicine

## 2023-11-28 VITALS — BP 124/82 | HR 68 | Temp 98.0°F | Ht 63.75 in | Wt 157.4 lb

## 2023-11-28 DIAGNOSIS — E782 Mixed hyperlipidemia: Secondary | ICD-10-CM | POA: Diagnosis not present

## 2023-11-28 DIAGNOSIS — F3342 Major depressive disorder, recurrent, in full remission: Secondary | ICD-10-CM

## 2023-11-28 DIAGNOSIS — Z79899 Other long term (current) drug therapy: Secondary | ICD-10-CM

## 2023-11-28 DIAGNOSIS — F411 Generalized anxiety disorder: Secondary | ICD-10-CM | POA: Insufficient documentation

## 2023-11-28 DIAGNOSIS — Z636 Dependent relative needing care at home: Secondary | ICD-10-CM | POA: Diagnosis not present

## 2023-11-28 DIAGNOSIS — F5104 Psychophysiologic insomnia: Secondary | ICD-10-CM

## 2023-11-28 DIAGNOSIS — F4310 Post-traumatic stress disorder, unspecified: Secondary | ICD-10-CM

## 2023-11-28 DIAGNOSIS — E781 Pure hyperglyceridemia: Secondary | ICD-10-CM | POA: Diagnosis not present

## 2023-11-28 DIAGNOSIS — M84375A Stress fracture, left foot, initial encounter for fracture: Secondary | ICD-10-CM

## 2023-11-28 MED ORDER — TRAZODONE HCL 50 MG PO TABS: 25.0000 mg | ORAL_TABLET | Freq: Every evening | ORAL | 3 refills | Status: DC | PRN

## 2023-11-28 MED ORDER — LORAZEPAM 0.5 MG PO TABS: 0.5000 mg | ORAL_TABLET | Freq: Every day | ORAL | 0 refills | Status: AC

## 2023-11-28 MED ORDER — FISH OIL 1000 MG PO CAPS: 2.0000 | ORAL_CAPSULE | Freq: Two times a day (BID) | ORAL | 3 refills | Status: AC

## 2023-11-28 MED ORDER — SERTRALINE HCL 100 MG PO TABS: 100.0000 mg | ORAL_TABLET | Freq: Every day | ORAL | 3 refills | Status: DC

## 2023-11-28 NOTE — Assessment & Plan Note (Signed)
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

## 2023-11-28 NOTE — Assessment & Plan Note (Signed)
Currently stable on sertraline No active symptoms reported Regular monitoring warranted given current stressors    11/28/2023   11:38 AM 09/06/2023    2:46 PM 03/02/2023    2:50 PM  Depression screen PHQ 2/9  Decreased Interest 0 0 0  Down, Depressed, Hopeless 0 1 0  PHQ - 2 Score 0 1 0  Altered sleeping  2 0  Tired, decreased energy  2 0  Change in appetite  0 0  Feeling bad or failure about yourself   1 0  Trouble concentrating  2 0  Moving slowly or fidgety/restless  2 0  Suicidal thoughts  0   PHQ-9 Score  10 0  Difficult doing work/chores  Somewhat difficult Not difficult at all

## 2023-11-28 NOTE — Progress Notes (Signed)
[facility]  Phone: [phone]  - Medical Office Visit -  Visit Date: 11/28/2023 Patient: Dennis [name]   DOB: [birth year]   47 y.o. Male  MRN: [id number] Patient Care Team: Lula Olszewski, MD as PCP - General (Internal Medicine) Jenel Lucks, MD as Consulting Physician (Gastroenterology) Today's Health Care Provider: Lula Olszewski, MD  ===========================================   Assessment & Plan GAD (generalized anxiety disorder) Currently managed with sertraline 100mg  daily Exacerbated by caregiver burden and life stressors Previously benefited from EMDR therapy Plan: Continue sertraline 100mg  daily, provided prescription refill Monitor for need of additional therapeutic support Hypertriglyceridemia Elevated triglycerides on recent labs No cardiovascular calcification on imaging Dietary management currently challenging due to circumstances Plan: Start Omega-3 fatty acids 1000mg  twice daily Dietary counseling provided regarding carbohydrate reduction Will reassess lipid panel at follow-up Medications: not yet discussed Lab Results  Component Value Date   HDL 36 (L) 09/13/2023   HDL 32 (L) 05/10/2022   HDL 40 03/09/2020   CHOLHDL 7.1 (H) 09/13/2023   CHOLHDL 5.9 (H) 05/10/2022   CHOLHDL 5.8 (H) 03/09/2020   Lab Results  Component Value Date   LDLCALC 176 (H) 09/13/2023   LDLCALC 136 (H) 05/10/2022   LDLCALC 162 (H) 03/09/2020   Lab Results  Component Value Date   TRIG 259 (H) 09/13/2023   TRIG 116 05/10/2022   TRIG 156 (H) 03/09/2020   Lab Results  Component Value Date   CHOL 254 (H) 09/13/2023   CHOL 190 05/10/2022   CHOL 232 (H) 03/09/2020   The 10-year ASCVD risk score (Arnett DK, et al., 2019) is: 5.5%   Values used to calculate the score:     Age: 29 years     Sex: Male     Is Non-Hispanic African American: No     Diabetic: No     Tobacco smoker: No     Systolic Blood Pressure: 124 mmHg     Is BP treated: No      HDL Cholesterol: 36 mg/dL     Total Cholesterol: 254 mg/dL Lab Results  Component Value Date   ALT 14 09/13/2023   AST 16 09/13/2023   ALKPHOS 46 08/31/2019   TSH 1.73 09/13/2023   Body mass index is 27.23 kg/m.  Lipoprotein(a), Apolipoprotein B (ApoB), and High-sensitivity C-reactive protein (hs-CRP) No results found for: "HSCRP", "LIPOA" Improving Your Cholesterol: Diet: Focus on a Mediterranean-style diet, limit saturated fats and sugars, and increase omega-3 fatty acids (fish, flaxseeds,nuts,extra virgin olive oil, avocados). Exercise: Engage in regular physical activity (aerobic exercises are particularly beneficial for HDL). Weight Management: Maintain a healthy weight. Smoking Cessation: Quitting smoking improves cholesterol levels.  Caregiver burden Primary caregiver for wife with recurrent glioblastoma Managing care of five children Significantly impacts daily routine and self-care capacity Plan: Regular assessment of support needs Consider referral to caregiver support services if needed Mixed hyperlipidemia Elevated triglycerides on recent labs No cardiovascular calcification on imaging Dietary management currently challenging due to circumstances Plan: Start Omega-3 fatty acids 1000mg  twice daily Dietary counseling provided regarding carbohydrate reduction Will reassess lipid panel at follow-up High risk medication use  Psychophysiological insomnia Managed with lorazepam 0.5mg  at bedtime Related to anxiety and caregiver responsibilities Plan: Continue lorazepam with careful monitoring Discussed risks/benefits of long-term benzodiazepine use Monthly medication review and refills as appropriate Trial trazodone to reduce lorazepam dependence Recurrent major depressive disorder, in full remission (HCC) Currently stable on sertraline No active symptoms reported Regular monitoring warranted given current stressors  11/28/2023   11:38 AM 09/06/2023    2:46 PM  03/02/2023    2:50 PM  Depression screen PHQ 2/9  Decreased Interest 0 0 0  Down, Depressed, Hopeless 0 1 0  PHQ - 2 Score 0 1 0  Altered sleeping  2 0  Tired, decreased energy  2 0  Change in appetite  0 0  Feeling bad or failure about yourself   1 0  Trouble concentrating  2 0  Moving slowly or fidgety/restless  2 0  Suicidal thoughts  0   PHQ-9 Score  10 0  Difficult doing work/chores  Somewhat difficult Not difficult at all    PTSD (post-traumatic stress disorder) Previously treated with EMDR therapy with good response Currently stable without acute symptoms Continue monitoring given ongoing life stressor Stress fracture of metatarsal bone of left foot, initial encounter Recent finding on ultrasound Currently limiting exercise routine Plan: Activity modification as tolerated Advised patient likely is leftover from triathlon Assessment and Plan            ED Discharge Orders          Ordered    sertraline (ZOLOFT) 100 MG tablet  Daily        11/28/23 1214    Omega-3 Fatty Acids (FISH OIL) 1000 MG CAPS  2 times daily before lunch and supper        11/28/23 1215    LORazepam (ATIVAN) 0.5 MG tablet  Daily at bedtime        11/28/23 1240    traZODone (DESYREL) 50 MG tablet  At bedtime PRN        11/28/23 1240          Diagnoses and all orders for this visit: Caregiver burden Hypertriglyceridemia -     Omega-3 Fatty Acids (FISH OIL) 1000 MG CAPS; Take 2 capsules (2,000 mg total) by mouth 2 (two) times daily before lunch and supper. GAD (generalized anxiety disorder) -     sertraline (ZOLOFT) 100 MG tablet; Take 1 tablet (100 mg total) by mouth daily. Mixed hyperlipidemia High risk medication use Psychophysiological insomnia -     traZODone (DESYREL) 50 MG tablet; Take 0.5-1 tablets (25-50 mg total) by mouth at bedtime as needed for sleep. Recurrent major depressive disorder, in full remission (HCC) -     sertraline (ZOLOFT) 100 MG tablet; Take 1 tablet (100 mg  total) by mouth daily. -     traZODone (DESYREL) 50 MG tablet; Take 0.5-1 tablets (25-50 mg total) by mouth at bedtime as needed for sleep. PTSD (post-traumatic stress disorder) -     LORazepam (ATIVAN) 0.5 MG tablet; Take 1 tablet (0.5 mg total) by mouth at bedtime.  @Recommended  follow up: Follow-up: Schedule return visit in 1-3 months for medication management and health maintenance review. Earlier if needed. Labs Due: Lipid panel at next visit Future Appointments  Date Time Provider Department Center  01/09/2024  1:20 PM Lula Olszewski, MD [facility]  03/06/2024  8:20 AM [name], Maryjean Morn, MD [facility]      Subjective  47 y.o. male who has Right lumbar radiculopathy; Nonallopathic lesion of thoracic region; Nonallopathic lesion of lumbar region; Nonallopathic lesion of sacral region; Loss of transverse plantar arch; Ulnar nerve compression, left; Scapular dyskinesis; Metatarsal stress fracture of left foot; Hypertriglyceridemia; Caregiver burden; GAD (generalized anxiety disorder); Mixed hyperlipidemia; High risk medication use; Psychophysiological insomnia; Recurrent major depressive disorder, in full remission (HCC); and PTSD (post-traumatic stress disorder) on their problem list.  His reasons/main concerns/chief complaints for today's office visit are Transfer of care   ----------------------------------------------------------------------------------------------------------------- AI-Extracted: Discussed the use of AI scribe software for clinical note transcription with the patient, who gave verbal consent to proceed.   History of Present Illness:  47 year old male presents for transfer of care with multiple active conditions. Primary concern centers around generalized anxiety disorder management and caregiver burden related to his wife's glioblastoma, initially diagnosed in February 2022 with recurrence in September 2023. Patient has been managing with sertraline 100mg  daily and  lorazepam 0.5mg  at bedtime, though previously attempted to wean off sertraline before his wife's cancer recurrence necessitated resuming full dose.  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 He denies current suicidal ideation and demonstrates good coping mechanisms, having completed EMDR therapy with positive results and no recent panic attacks. Sleep remains disrupted, requiring lorazepam for management.  Secondary health concern involves elevated triglycerides, previously evaluated with cardiovascular imaging showing no calcification. Patient acknowledges dietary management has been challenging due to reliance on meals provided by support network during wife's illness.  Recent imaging includes normal lumbar spine x-ray (2023) and foot ultrasound showing stress fracture (2024), attributed to triathlon training before wife's cancer recurrence. Past medical history includes resolved colitis-like symptoms from 2020, requiring no ongoing management.  Patient expresses previous challenges with healthcare coordination, seeking a more collaborative approach to manage his conditions while maintaining his caregiver role for his wife of 24 years and their five children.   ----------------------------------------------------------------------------------------------------------------- Problem list overviews that were updated at today's visit: Problem  Hypertriglyceridemia  Caregiver Burden  Gad (Generalized Anxiety Disorder)  Mixed Hyperlipidemia  High Risk Medication Use  Psychophysiological Insomnia  Recurrent Major Depressive Disorder, in Full Remission (Hcc)  Ptsd (Post-Traumatic Stress Disorder)    Medications reviewed and modified: No current outpatient medications on file prior to visit.   No current facility-administered medications on file prior to visit.    Medications Discontinued During This Encounter  Medication Reason   sertraline (ZOLOFT) 50 MG tablet    sertraline (ZOLOFT) 100 MG tablet    LORazepam (ATIVAN) 0.5 MG tablet Reorder    Problems: has Right lumbar radiculopathy; Nonallopathic lesion of thoracic region; Nonallopathic lesion of lumbar region; Nonallopathic lesion of sacral region; Loss of transverse plantar arch; Ulnar nerve compression, left; Scapular dyskinesis; Metatarsal stress fracture of left foot; Hypertriglyceridemia; Caregiver burden; GAD (generalized anxiety disorder); Mixed hyperlipidemia; High risk medication use; Psychophysiological insomnia; Recurrent major depressive disorder, in full remission (HCC); and PTSD (post-traumatic stress disorder) on their problem list. Current Meds  Medication Sig   Omega-3 Fatty Acids (FISH OIL) 1000 MG CAPS Take 2 capsules (2,000 mg total) by mouth 2 (two) times daily before lunch and supper.   traZODone (DESYREL) 50 MG tablet Take 0.5-1 tablets (25-50 mg total) by mouth at bedtime as needed for sleep.   [DISCONTINUED] LORazepam (ATIVAN) 0.5 MG tablet TAKE 1 TO 2 TABLETS BY MOUTH TWICE DAILY AS NEEDED FOR ANXIETY   [DISCONTINUED] sertraline (ZOLOFT) 100 MG tablet Take 1 tablet (100 mg total) by mouth daily.   Allergies:  No Known Allergies Past Medical History:  has a past medical history of Acute colitis (08/2019), Allergy, Asthma, GAD (generalized anxiety disorder), Hypercholesterolemia (01/2019), OCD (obsessive compulsive disorder), Seasonal allergies, and Vitamin D deficiency. Past Surgical History:   has a past surgical history that includes Vasectomy; vasectomy reverasal; Cardiovascular stress test (12/2017); and Colonoscopy w/  polypectomy. Social History:   reports that he has never smoked. He has never used smokeless tobacco. He reports that he does not drink alcohol and does not use drugs. Family History:  family history includes Alcohol abuse in his maternal grandfather;  Arthritis in his maternal grandmother; COPD in his paternal grandfather; Cancer in his maternal grandmother, mother, and paternal grandfather; Colon cancer in his maternal grandmother; Crohn's disease in his paternal aunt; Depression in his mother; Early death in his father and maternal grandmother; Heart disease in his maternal grandfather; Hypertension in his father; Stroke in his father, maternal grandfather, and mother. Depression Screen and Health Maintenance:    11/28/2023   11:38 AM 09/06/2023    2:46 PM 03/02/2023    2:50 PM 08/25/2022    3:32 PM  PHQ 2/9 Scores  PHQ - 2 Score 0 1 0 0  PHQ- 9 Score  10 0 0   Health Maintenance  Topic Date Due   INFLUENZA VACCINE  03/24/2024 (Originally 07/26/2023)   Hepatitis C Screening  09/05/2024 (Originally 09/27/1993)   Colonoscopy  06/20/2029   DTaP/Tdap/Td (2 - Td or Tdap) 03/05/2030   HPV VACCINES  Aged Out   COVID-19 Vaccine  Discontinued   HIV Screening  Discontinued   Immunization History  Administered Date(s) Administered   Influenza Inj Mdck Quad Pf 10/20/2017, 10/22/2018   Influenza,inj,Quad PF,6+ Mos 10/17/2019   Janssen (J&J) SARS-COV-2 Vaccination 03/29/2021   Tdap 03/05/2020     Objective   Physical ExamBP 124/82   Pulse 68   Temp 98 F (36.7 C) (Temporal)   Ht 5' 3.75" (1.619 m)   Wt 157 lb 6.4 oz (71.4 kg)   SpO2 97%   BMI 27.23 kg/m  Wt Readings from Last 10 Encounters:  11/28/23 157 lb 6.4 oz (71.4 kg)  09/06/23 153 lb 9.6 oz (69.7 kg)  04/26/23 152 lb (68.9 kg)  03/02/23 154 lb 6.4 oz (70 kg)  02/27/23 155 lb (70.3 kg)  01/25/23 153 lb (69.4 kg)  10/24/22 152 lb (68.9 kg)  08/08/22 149 lb (67.6 kg)  06/21/22 153 lb (69.4 kg)  06/20/22 153 lb (69.4 kg)  Vital signs reviewed.  Nursing notes reviewed. Weight trend reviewed. Abnormalities and problem-specific physical exam findings:  highly intelligent. General Appearance:  Well developed, well nourished, well-groomed, healthy-appearing male with Body mass index  is 27.23 kg/m. No acute distress appreciable.   Skin: Clear and well-hydrated. Pulmonary:  Normal work of breathing at rest, no respiratory distress apparent. SpO2: 97 %  Musculoskeletal: He demonstrates smooth and coordinated movements throughout all major joints.All extremities are intact.  Neurological:  Awake, alert, oriented, and engaged.  No obvious focal neurological deficits or cognitive impairments.  Sensorium seems unclouded.  Psychiatric:  Appropriate mood, pleasant and cooperative demeanor, cheerful and engaged during the exam  Reviewed Results & Data Results            No results found for any visits on 11/28/23.  Lab on 09/13/2023  Component Date Value   Glucose, Bld 09/13/2023 98    BUN 09/13/2023 17    Creat 09/13/2023 0.99    BUN/Creatinine Ratio 09/13/2023 SEE NOTE:    Sodium 09/13/2023 140    Potassium 09/13/2023 4.6    Chloride 09/13/2023 102    CO2 09/13/2023 26    Calcium 09/13/2023 9.9    Total Protein 09/13/2023 6.9    Albumin 09/13/2023 4.4    Globulin 09/13/2023 2.5    AG Ratio 09/13/2023 1.8  Total Bilirubin 09/13/2023 0.5    Alkaline phosphatase (AP* 09/13/2023 52    AST 09/13/2023 16    ALT 09/13/2023 14    Cholesterol 09/13/2023 254 (H)    HDL 09/13/2023 36 (L)    Triglycerides 09/13/2023 259 (H)    LDL Cholesterol (Calc) 09/13/2023 176 (H)    Total CHOL/HDL Ratio 09/13/2023 7.1 (H)    Non-HDL Cholesterol (Cal* 09/13/2023 218 (H)    TSH 09/13/2023 1.73    WBC 09/13/2023 5.3    RBC 09/13/2023 5.52    Hemoglobin 09/13/2023 16.7    HCT 09/13/2023 51.4 (H)    MCV 09/13/2023 93.1    MCH 09/13/2023 30.3    MCHC 09/13/2023 32.5    RDW 09/13/2023 12.4    Platelets 09/13/2023 247    MPV 09/13/2023 9.6    Neutro Abs 09/13/2023 2,714    Lymphs Abs 09/13/2023 1,728    Absolute Monocytes 09/13/2023 546    Eosinophils Absolute 09/13/2023 270    Basophils Absolute 09/13/2023 42    Neutrophils Relative % 09/13/2023 51.2    Total Lymphocyte  09/13/2023 32.6    Monocytes Relative 09/13/2023 10.3    Eosinophils Relative 09/13/2023 5.1    Basophils Relative 09/13/2023 0.8   Office Visit on 03/02/2023  Component Date Value   [facility] Summary 03/02/2023     Amphetamines 03/02/2023 NEGATIVE    Barbiturates 03/02/2023 NEGATIVE    Benzodiazepines 03/02/2023 POSITIVE (A)    Alphahydroxyalprazolam 03/02/2023 NEGATIVE    Alphahydroxymidazolam 03/02/2023 NEGATIVE    Alphahydroxytriazolam 03/02/2023 NEGATIVE    Aminoclonazepam 03/02/2023 NEGATIVE    Hydroxyethylflurazepam 03/02/2023 NEGATIVE    Lorazepam 03/02/2023 104 (H)    medMATCH Lorazepam 03/02/2023 INCONSISTENT (A)    Nordiazepam 03/02/2023 NEGATIVE    Oxazepam 03/02/2023 NEGATIVE    Temazepam 03/02/2023 NEGATIVE    Benzodiazepines Comments 03/02/2023     Cocaine Metabolite 03/02/2023 NEGATIVE    Opiates 03/02/2023 NEGATIVE    Oxycodone 03/02/2023 NEGATIVE    Desmethyltramadol 03/02/2023 NEGATIVE    Tramadol 03/02/2023 NEGATIVE    Tramadol Comments 03/02/2023     Notes and Comments 03/02/2023     No image results found.   CT CARDIAC SCORING (SELF PAY ONLY)  Addendum Date: 10/07/2023   ADDENDUM REPORT: 10/07/2023 09:44 EXAM: OVER-READ INTERPRETATION CT CHEST The following report is an over-read performed by radiologist Dr. Romona Curls of [facility] on 10/07/2023. This over-read does not include interpretation of cardiac or coronary anatomy or pathology. The coronary calcium score interpretation by the cardiologist is attached. COMPARISON:  Chest radiograph dated 08/31/2019 FINDINGS: Cardiovascular: No significant extracardiac vascular findings. Normal heart size. No pericardial effusion. Mediastinum/Nodes: No enlarged mediastinal lymph nodes. The visible trachea and esophagus demonstrate no significant findings. Lungs/Pleura: The visible lungs are clear. No pleural effusion. Upper Abdomen: No acute abnormality. Musculoskeletal: No chest wall mass or suspicious  bone lesions identified. IMPRESSION: No significant incidental findings. Electronically Signed   By: Romona Curls M.D.   On: 10/07/2023 09:44   Result Date: 10/07/2023 : Cardiovascular Disease Risk stratification EXAM: Coronary Calcium Score TECHNIQUE: A gated, non-contrast computed tomography scan of the heart was performed using 3mm slice thickness. Axial images were analyzed on a dedicated workstation. Calcium scoring of the coronary arteries was performed using the Agatston method. FINDINGS: Coronary arteries: Normal origins. Coronary Calcium Score: Left main: 0 Left anterior descending artery: 0 Left circumflex artery: 0 Right coronary artery: 0 Total: 0 Percentile: 0 Pericardium: Normal. Ascending Aorta: Normal caliber. Non-cardiac: See separate report from  [facility]. IMPRESSION: Coronary calcium score of 0. This was 0 percentile for age-, race-, and sex-matched controls. RECOMMENDATIONS: Coronary artery calcium (CAC) score is a strong predictor of incident coronary heart disease (CHD) and provides predictive information beyond traditional risk factors. CAC scoring is reasonable to use in the decision to withhold, postpone, or initiate statin therapy in intermediate-risk or selected borderline-risk asymptomatic adults (age 39-75 years and LDL-C >=70 to <190 mg/dL) who do not have diabetes or established atherosclerotic cardiovascular disease (ASCVD).* In intermediate-risk (10-year ASCVD risk >=7.5% to <20%) adults or selected borderline-risk (10-year ASCVD risk >=5% to <7.5%) adults in whom a CAC score is measured for the purpose of making a treatment decision the following recommendations have been made: If CAC=0, it is reasonable to withhold statin therapy and reassess in 5 to 10 years, as long as higher risk conditions are absent (diabetes mellitus, family history of premature CHD in first degree relatives (males <55 years; females <65 years), cigarette smoking, or LDL >=190 mg/dL). If CAC is  1 to 99, it is reasonable to initiate statin therapy for patients >=43 years of age. If CAC is >=100 or >=75th percentile, it is reasonable to initiate statin therapy at any age. Cardiology referral should be considered for patients with CAC scores >=400 or >=75th percentile. *2018 AHA/ACC/AACVPR/AAPA/ABC/ACPM/ADA/AGS/APhA/ASPC/NLA/PCNA Guideline on the Management of Blood Cholesterol: A Report of the American College of Cardiology/American Heart Association Task Force on Clinical Practice Guidelines. J Am Coll Cardiol. 2019;73(24):3168-3209. Thomasene Ripple, DO [facility] The noncardiac portion of this study will be interpreted in separate report by the radiologist. Electronically Signed: By: Thomasene Ripple D.O. On: 10/01/2023 18:43    CT CARDIAC SCORING (SELF PAY ONLY)  Addendum Date: 10/07/2023   ADDENDUM REPORT: 10/07/2023 09:44 EXAM: OVER-READ INTERPRETATION CT CHEST The following report is an over-read performed by radiologist Dr. Romona Curls of [facility] on 10/07/2023. This over-read does not include interpretation of cardiac or coronary anatomy or pathology. The coronary calcium score interpretation by the cardiologist is attached. COMPARISON:  Chest radiograph dated 08/31/2019 FINDINGS: Cardiovascular: No significant extracardiac vascular findings. Normal heart size. No pericardial effusion. Mediastinum/Nodes: No enlarged mediastinal lymph nodes. The visible trachea and esophagus demonstrate no significant findings. Lungs/Pleura: The visible lungs are clear. No pleural effusion. Upper Abdomen: No acute abnormality. Musculoskeletal: No chest wall mass or suspicious bone lesions identified. IMPRESSION: No significant incidental findings. Electronically Signed   By: Romona Curls M.D.   On: 10/07/2023 09:44     Time-Based Medical Decision Making Documentation  I personally spent 45 minutes on the day of the visit engaged in activities related to this patient's care, including:  Pre-visit  Review: - Comprehensive review of previous medical records from prior provider - Analysis of historical problem list discrepancies and accuracy - Review of previous diagnostic studies including cardiac imaging, lumbar x-rays, and lab results - Assessment of medication history and current needs  Direct Patient Interaction: - Establishing therapeutic alliance with new transfer patient - Detailed discussion of caregiver burden and psychosocial stressors - Thorough medication reconciliation including controlled substance management - Patient education regarding triglyceride management and cardiovascular risk - Counseling about benzodiazepine risks and continued necessity - Discussion of ongoing mental health management strategies  Post-visit Care Coordination: - Updating and correcting problem list for accuracy - Medication prescription management including controlled substances - Documentation of comprehensive care plan - Coordination of follow-up scheduling  Extended visit time was medically necessary due to: 1. Complexity of transfer of care requiring thorough  record review and reconciliation 2. Multiple chronic conditions requiring medication management 3. Need for extensive patient counseling regarding both physical and mental health conditions 4. Complex psychosocial situation requiring additional support and resources 5. Coordination of care for both pharmaceutical and lifestyle interventions  This time was independent of any separately billable procedures.  The visit duration exceeded typical time due to the necessity of establishing therapeutic alliance with a transfer patient who has reported experiencing previous negative healthcare interactions, while simultaneously managing multiple chronic conditions requiring careful medication management and lifestyle modifications.  Additional notes: Initial Appointment Goals:  This initial visit focused on establishing a foundation for  the patient's care. We collaboratively reviewed his medical history and medications in detail, updating the chart as shown in the encounter. Given the extensive information, we prioritized addressing his most pressing concerns, which he reported were: Transfer of care  While the complexity of the patient's medical picture may necessitate further evaluation in subsequent visits, we were able to develop a preliminary care plan together. To expedite a comprehensive plan at the next visit, we encouraged the patient to gather relevant medical records from previous providers. This collaborative approach will ensure a more complete understanding of the patient's health and inform the development of a personalized care plan. We look forward to continuing the conversation and working together with the patient on achieving his health goals.   Collaborative Documentation:  Today's encounter utilized real-time, dynamic patient engagement.  Patients actively participate by directly reviewing and assisting in updating their medical records through a shared screen. This transparency empowers patients to visually confirm chart updates made by the healthcare provider.  This collaborative approach facilitates problem management as we jointly update the problem list, problem overview, and assessment/plan. Ultimately, this process enhances chart accuracy and completeness, fostering shared decision-making, patient education, and informed consent for tests and treatments.  Collaborative Treatment Planning:  Treatment plans were discussed and reviewed in detail.  Explained medication safety and potential side effects.  Encouraged participation and answered all patient questions, confirming understanding and comfort with the plan. Encouraged patient to contact our office if they have any questions or concerns. Agreed on patient returning to office if symptoms worsen, persist, or new symptoms develop. Discussed precautions in case of  needing to visit the Emergency Department.  ----------------------------------------------------- Lula Olszewski, MD  11/28/2023 7:44 PM  [facility]:  [phone]

## 2023-11-28 NOTE — Assessment & Plan Note (Signed)
Primary caregiver for wife with recurrent glioblastoma Managing care of five children Significantly impacts daily routine and self-care capacity Plan: Regular assessment of support needs Consider referral to caregiver support services if needed

## 2023-11-28 NOTE — Assessment & Plan Note (Signed)
Elevated triglycerides on recent labs No cardiovascular calcification on imaging Dietary management currently challenging due to circumstances Plan: Start Omega-3 fatty acids 1000mg  twice daily Dietary counseling provided regarding carbohydrate reduction Will reassess lipid panel at follow-up

## 2023-11-28 NOTE — Assessment & Plan Note (Signed)
Previously treated with EMDR therapy with good response Currently stable without acute symptoms Continue monitoring given ongoing life stressor

## 2023-11-28 NOTE — Patient Instructions (Addendum)
After Visit Summary  Thank you for coming in today. Here is a summary of your visit and instructions to follow:  Medications: - Continue Zoloft (sertraline) 100mg  once daily - Continue Ativan (lorazepam) 0.5mg  at bedtime as needed for sleep - Start taking Fish Oil (Omega-3) 1000mg  capsules - take 2 capsules twice daily before lunch and dinner  Important Health Instructions: 1. Triglyceride Management:    - Take fish oil supplements as prescribed    - Try to reduce carbohydrate intake when possible    - Focus on lean proteins and vegetables when available  2. Mental Health Management:    - Take medications as prescribed    - Continue using stress management techniques    - Call if you experience increased anxiety or depression symptoms  3. Sleep Care:    - Use lorazepam as prescribed for sleep    - Maintain consistent bedtime routine when possible    - Call if sleep patterns significantly worsen  Warning Signs - Call the Office if: - You experience withdrawal symptoms from any medications - Your anxiety or depression significantly worsens - You have thoughts of self-harm - You develop new or concerning symptoms  Your Next Appointments: - Follow-up visit scheduled for 03/06/2024 at 8:20 AM with Dr. Milinda Cave - Interim visit with Dr. Jon Billings on 01/09/2024 at 1:20 PM  Phone Numbers: - Office: Jobie Quaker office number] - After Hours: Jobie Quaker after hours number] - Emergency: 911 for any urgent medical concerns  Prescriptions have been sent to your pharmacy. Please allow 24-48 hours for processing.  Remember: Your health is important not just for you but also for your role as a caregiver. Don't hesitate to reach out if you need support.   # Zoloft (Sertraline) Flexible Tapering Schedule  ## Equipment Needed - High-quality pill cutter - 100 mg sertraline tablets - Journal to track symptoms and dosage  ## Tapering Principles - Reduce dose gradually - Monitor symptoms closely -  Adjust timeline based on individual tolerance - Goal: Minimize withdrawal symptoms  ## Proposed Reduction Plan (12-18 months)  ### Initial Phase (Months 1-3): 100 mg ? 75 mg - Week 1-4: 100 mg daily - Week 5-8: 87.5 mg daily    - Cut 100 mg tablet into 7/8 pieces (approx. 87.5 mg) - Week 9-12: 75 mg daily   - Cut 100 mg tablet into 3/4 pieces (75 mg)  ### Intermediate Phase (Months 4-9): 75 mg ? 50 mg - Reduce 12.5 mg every 4-6 weeks - 75 mg ? 62.5 mg ? 50 mg - Allow flexibility to pause or slow reduction if withdrawal symptoms emerge  ### Final Phase (Months 10-18): 50 mg ? 25 mg ? Discontinuation - Reduce 6.25 mg increments - 50 mg ? 43.75 mg ? 37.5 mg ? 31.25 mg ? 25 mg - Optional: Consider liquid formulation for most precise tapering  ## Symptom Tracking - Record daily:   1. Current dosage   2. Withdrawal symptoms   3. Mood and functional status   4. Sleep quality  ## Warning Signs (Pause/Consult Physician) - Severe dizziness - Significant mood destabilization - Persistent irritability - Suicidal thoughts - Extreme fatigue  ## Supplemental Support - Omega-3 fatty acids - Magnesium - Regular exercise - Consistent sleep schedule  ## Disclaimer - Personalize with healthcare provider - Not a substitute for medical advice - Individual response varies

## 2023-11-28 NOTE — Assessment & Plan Note (Signed)
Recent finding on ultrasound Currently limiting exercise routine Plan: Activity modification as tolerated Advised patient likely is leftover from triathlon

## 2023-11-28 NOTE — Assessment & Plan Note (Signed)
Elevated triglycerides on recent labs No cardiovascular calcification on imaging Dietary management currently challenging due to circumstances Plan: Start Omega-3 fatty acids 1000mg  twice daily Dietary counseling provided regarding carbohydrate reduction Will reassess lipid panel at follow-up Medications: not yet discussed Lab Results  Component Value Date   HDL 36 (L) 09/13/2023   HDL 32 (L) 05/10/2022   HDL 40 03/09/2020   CHOLHDL 7.1 (H) 09/13/2023   CHOLHDL 5.9 (H) 05/10/2022   CHOLHDL 5.8 (H) 03/09/2020   Lab Results  Component Value Date   LDLCALC 176 (H) 09/13/2023   LDLCALC 136 (H) 05/10/2022   LDLCALC 162 (H) 03/09/2020   Lab Results  Component Value Date   TRIG 259 (H) 09/13/2023   TRIG 116 05/10/2022   TRIG 156 (H) 03/09/2020   Lab Results  Component Value Date   CHOL 254 (H) 09/13/2023   CHOL 190 05/10/2022   CHOL 232 (H) 03/09/2020   The 10-year ASCVD risk score (Arnett DK, et al., 2019) is: 5.5%   Values used to calculate the score:     Age: 47 years     Sex: Male     Is Non-Hispanic African American: No     Diabetic: No     Tobacco smoker: No     Systolic Blood Pressure: 124 mmHg     Is BP treated: No     HDL Cholesterol: 36 mg/dL     Total Cholesterol: 254 mg/dL Lab Results  Component Value Date   ALT 14 09/13/2023   AST 16 09/13/2023   ALKPHOS 46 08/31/2019   TSH 1.73 09/13/2023   Body mass index is 27.23 kg/m.  Lipoprotein(a), Apolipoprotein B (ApoB), and High-sensitivity C-reactive protein (hs-CRP) No results found for: "HSCRP", "LIPOA" Improving Your Cholesterol: Diet: Focus on a Mediterranean-style diet, limit saturated fats and sugars, and increase omega-3 fatty acids (fish, flaxseeds,nuts,extra virgin olive oil, avocados). Exercise: Engage in regular physical activity (aerobic exercises are particularly beneficial for HDL). Weight Management: Maintain a healthy weight. Smoking Cessation: Quitting smoking improves cholesterol  levels.

## 2023-11-28 NOTE — Assessment & Plan Note (Signed)
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

## 2023-12-05 ENCOUNTER — Other Ambulatory Visit (HOSPITAL_COMMUNITY): Payer: Self-pay

## 2023-12-07 ENCOUNTER — Encounter: Payer: No Typology Code available for payment source | Admitting: Internal Medicine

## 2024-01-04 ENCOUNTER — Encounter: Payer: Self-pay | Admitting: Internal Medicine

## 2024-01-09 ENCOUNTER — Ambulatory Visit: Payer: No Typology Code available for payment source | Admitting: Internal Medicine

## 2024-01-23 ENCOUNTER — Encounter: Payer: Self-pay | Admitting: Internal Medicine

## 2024-01-25 ENCOUNTER — Encounter: Payer: Self-pay | Admitting: Internal Medicine

## 2024-01-25 ENCOUNTER — Ambulatory Visit (INDEPENDENT_AMBULATORY_CARE_PROVIDER_SITE_OTHER): Payer: No Typology Code available for payment source | Admitting: Internal Medicine

## 2024-01-25 VITALS — BP 146/90 | HR 89 | Temp 98.0°F | Ht 63.75 in | Wt 154.2 lb

## 2024-01-25 DIAGNOSIS — E785 Hyperlipidemia, unspecified: Secondary | ICD-10-CM | POA: Diagnosis not present

## 2024-01-25 DIAGNOSIS — F5101 Primary insomnia: Secondary | ICD-10-CM | POA: Diagnosis not present

## 2024-01-25 DIAGNOSIS — F411 Generalized anxiety disorder: Secondary | ICD-10-CM | POA: Diagnosis not present

## 2024-01-25 DIAGNOSIS — Z636 Dependent relative needing care at home: Secondary | ICD-10-CM | POA: Diagnosis not present

## 2024-01-25 DIAGNOSIS — Z79899 Other long term (current) drug therapy: Secondary | ICD-10-CM

## 2024-01-25 MED ORDER — RAMELTEON 8 MG PO TABS: 8.0000 mg | ORAL_TABLET | Freq: Every day | ORAL | 2 refills | Status: AC

## 2024-01-25 MED ORDER — LORAZEPAM 0.5 MG PO TABS: 0.5000 mg | ORAL_TABLET | Freq: Two times a day (BID) | ORAL | 1 refills | Status: AC | PRN

## 2024-01-25 NOTE — Progress Notes (Unsigned)
==============================  [facility]: [phone]   -- Medical Office Visit --  Patient: Dennis [name]      Age: 48 y.o.       Sex:  male  Date:   01/25/2024 Today's Healthcare Provider: Lula Olszewski, MD  ==============================   CHIEF COMPLAINT: 1 month follow-up (Stopped taking the lorazepam and started back with it as of  last night to help him sleep and stopped taking the trazodone yesterday.), Anxiety, Insomnia, and Stress  SUBJECTIVE: Background This is a 48 y.o. male who has Right lumbar radiculopathy; Nonallopathic lesion of thoracic region; Nonallopathic lesion of lumbar region; Nonallopathic lesion of sacral region; Loss of transverse plantar arch; Ulnar nerve compression, left; Scapular dyskinesis; Metatarsal stress fracture of left foot; Hypertriglyceridemia; Caregiver burden; GAD (generalized anxiety disorder); Mixed hyperlipidemia; High risk medication use; Psychophysiological insomnia; Recurrent major depressive disorder, in full remission (HCC); and PTSD (post-traumatic stress disorder) on their problem list.  History of Present Illness The patient is a 48 year old with anxiety and sleep disturbances who presents with difficulty managing medication side effects and sleep issues.  He has been experiencing difficulty managing anxiety and sleep disturbances. Attempts to wean off lorazepam and start trazodone resulted in vivid dreams and increased heart rate, leading to discontinuation of trazodone. He is unable to sleep through the night, waking up at 1 AM and 3 AM, and has returned to using lorazepam to manage sleep.  He is currently taking sertraline 100 mg daily and has been using omega-3 supplements, B vitamins, and vitamin D. He also mentions using light therapy, although the device is currently broken. He feels out of breath when waking up at night to use the bathroom, but does not wake up gasping for air. His resting  heart rate is around 85 bpm.  Significant stressors include losing health insurance temporarily, his son's speeding ticket, and his wife's ongoing health issues. He has concerns about heart health, noting higher blood pressure readings recently and a history of high triglycerides. A past CT coronary calcium score was clean, and he is not currently on cholesterol medication.  Family history is significant for cardiovascular events, with his mother having a stroke during an aneurysm repair and his father dying suddenly, possibly from a stroke. He is concerned about his own risk of heart attack or stroke given this family history.   Reviewed chart records that patient  has a past medical history of Acute colitis (08/2019), Allergy, Asthma, GAD (generalized anxiety disorder), Hypercholesterolemia (01/2019), OCD (obsessive compulsive disorder), Seasonal allergies, and Vitamin D deficiency.  Discussed Past Medical History - High blood pressure - High triglycerides  Discussed Family History - Mother had a stroke and an aneurysm - Father had a stroke and a heart attack  Problem list overviews that were updated at today's visit:No problems updated.  Today's Verbally Confirmed Medications - Lorazepam - Sertraline - Omega 3s - Vitamin B - Vitamin D Current Outpatient Medications on File Prior to Visit  Medication Sig   LORazepam (ATIVAN) 0.5 MG tablet Take 1 tablet (0.5 mg total) by mouth at bedtime.   Omega-3 Fatty Acids (FISH OIL) 1000 MG CAPS Take 2 capsules (2,000 mg total) by mouth 2 (two) times daily before lunch and supper.   sertraline (ZOLOFT) 100 MG tablet Take 1 tablet (100 mg total) by mouth daily.   No current facility-administered medications on file prior to visit.   Medications Discontinued During This Encounter  Medication Reason  traZODone (DESYREL) 50 MG tablet Completed Course      Objective   Physical Exam     01/25/2024    2:03 PM 01/25/2024    1:58 PM 11/28/2023    11:33 AM  Vitals with BMI  Height  5' 3.75" 5' 3.75"  Weight  154 lbs 3 oz 157 lbs 6 oz  BMI  26.68 27.24  Systolic 146 142 478  Diastolic 90 92 82  Pulse  89 68   Wt Readings from Last 10 Encounters:  01/25/24 154 lb 3.2 oz (69.9 kg)  11/28/23 157 lb 6.4 oz (71.4 kg)  09/06/23 153 lb 9.6 oz (69.7 kg)  04/26/23 152 lb (68.9 kg)  03/02/23 154 lb 6.4 oz (70 kg)  02/27/23 155 lb (70.3 kg)  01/25/23 153 lb (69.4 kg)  10/24/22 152 lb (68.9 kg)  08/08/22 149 lb (67.6 kg)  06/21/22 153 lb (69.4 kg)   Vital signs reviewed.  Nursing notes reviewed. Weight trend reviewed. Abnormalities and Problem-Specific physical exam findings:  stressed, but excellent stress management techniques demonstrated.  General Appearance:  No acute distress appreciable.   Well-groomed, healthy-appearing male.  Well proportioned with no abnormal fat distribution.  Good muscle tone. Pulmonary:  Normal work of breathing at rest, no respiratory distress apparent. SpO2: 96 %  Musculoskeletal: All extremities are intact.  Neurological:  Awake, alert, oriented, and engaged.  No obvious focal neurological deficits or cognitive impairments.  Sensorium seems unclouded.   Speech is clear and coherent with logical content. Psychiatric:  Appropriate mood, pleasant and cooperative demeanor, thoughtful and engaged during the exam    No results found for any visits on 01/25/24. Lab on 09/13/2023  Component Date Value   Glucose, Bld 09/13/2023 98    BUN 09/13/2023 17    Creat 09/13/2023 0.99    BUN/Creatinine Ratio 09/13/2023 SEE NOTE:    Sodium 09/13/2023 140    Potassium 09/13/2023 4.6    Chloride 09/13/2023 102    CO2 09/13/2023 26    Calcium 09/13/2023 9.9    Total Protein 09/13/2023 6.9    Albumin 09/13/2023 4.4    Globulin 09/13/2023 2.5    AG Ratio 09/13/2023 1.8    Total Bilirubin 09/13/2023 0.5    Alkaline phosphatase (AP* 09/13/2023 52    AST 09/13/2023 16    ALT 09/13/2023 14    Cholesterol  09/13/2023 254 (H)    HDL 09/13/2023 36 (L)    Triglycerides 09/13/2023 259 (H)    LDL Cholesterol (Calc) 09/13/2023 176 (H)    Total CHOL/HDL Ratio 09/13/2023 7.1 (H)    Non-HDL Cholesterol (Cal* 09/13/2023 218 (H)    TSH 09/13/2023 1.73    WBC 09/13/2023 5.3    RBC 09/13/2023 5.52    Hemoglobin 09/13/2023 16.7    HCT 09/13/2023 51.4 (H)    MCV 09/13/2023 93.1    MCH 09/13/2023 30.3    MCHC 09/13/2023 32.5    RDW 09/13/2023 12.4    Platelets 09/13/2023 247    MPV 09/13/2023 9.6    Neutro Abs 09/13/2023 2,714    Lymphs Abs 09/13/2023 1,728    Absolute Monocytes 09/13/2023 546    Eosinophils Absolute 09/13/2023 270    Basophils Absolute 09/13/2023 42    Neutrophils Relative % 09/13/2023 51.2    Total Lymphocyte 09/13/2023 32.6    Monocytes Relative 09/13/2023 10.3    Eosinophils Relative 09/13/2023 5.1    Basophils Relative 09/13/2023 0.8   Office Visit on 03/02/2023  Component Date Value  [facility] Summary 03/02/2023     Amphetamines 03/02/2023 NEGATIVE    Barbiturates 03/02/2023 NEGATIVE    Benzodiazepines 03/02/2023 POSITIVE (A)    Alphahydroxyalprazolam 03/02/2023 NEGATIVE    Alphahydroxymidazolam 03/02/2023 NEGATIVE    Alphahydroxytriazolam 03/02/2023 NEGATIVE    Aminoclonazepam 03/02/2023 NEGATIVE    Hydroxyethylflurazepam 03/02/2023 NEGATIVE    Lorazepam 03/02/2023 104 (H)    medMATCH Lorazepam 03/02/2023 INCONSISTENT (A)    Nordiazepam 03/02/2023 NEGATIVE    Oxazepam 03/02/2023 NEGATIVE    Temazepam 03/02/2023 NEGATIVE    Benzodiazepines Comments 03/02/2023     Cocaine Metabolite 03/02/2023 NEGATIVE    Opiates 03/02/2023 NEGATIVE    Oxycodone 03/02/2023 NEGATIVE    Desmethyltramadol 03/02/2023 NEGATIVE    Tramadol 03/02/2023 NEGATIVE    Tramadol Comments 03/02/2023     Notes and Comments 03/02/2023    No image results found. No results found.CT CARDIAC SCORING (SELF PAY ONLY) Addendum Date: 10/07/2023 ADDENDUM REPORT: 10/07/2023 09:44 EXAM: OVER-READ  INTERPRETATION CT CHEST The following report is an over-read performed by radiologist Dr. Romona Curls of [facility] on 10/07/2023. This over-read does not include interpretation of cardiac or coronary anatomy or pathology. The coronary calcium score interpretation by the cardiologist is attached. COMPARISON:  Chest radiograph dated 08/31/2019 FINDINGS: Cardiovascular: No significant extracardiac vascular findings. Normal heart size. No pericardial effusion. Mediastinum/Nodes: No enlarged mediastinal lymph nodes. The visible trachea and esophagus demonstrate no significant findings. Lungs/Pleura: The visible lungs are clear. No pleural effusion. Upper Abdomen: No acute abnormality. Musculoskeletal: No chest wall mass or suspicious bone lesions identified. IMPRESSION: No significant incidental findings. Electronically Signed   By: Romona Curls M.D.   On: 10/07/2023 09:44   Result Date: 10/07/2023 : Cardiovascular Disease Risk stratification EXAM: Coronary Calcium Score TECHNIQUE: A gated, non-contrast computed tomography scan of the heart was performed using 3mm slice thickness. Axial images were analyzed on a dedicated workstation. Calcium scoring of the coronary arteries was performed using the Agatston method. FINDINGS: Coronary arteries: Normal origins. Coronary Calcium Score: Left main: 0 Left anterior descending artery: 0 Left circumflex artery: 0 Right coronary artery: 0 Total: 0 Percentile: 0 Pericardium: Normal. Ascending Aorta: Normal caliber. Non-cardiac: See separate report from [facility]. IMPRESSION: Coronary calcium score of 0. This was 0 percentile for age-, race-, and sex-matched controls. RECOMMENDATIONS: Coronary artery calcium (CAC) score is a strong predictor of incident coronary heart disease (CHD) and provides predictive information beyond traditional risk factors. CAC scoring is reasonable to use in the decision to withhold, postpone, or initiate statin therapy in  intermediate-risk or selected borderline-risk asymptomatic adults (age 13-75 years and LDL-C >=70 to <190 mg/dL) who do not have diabetes or established atherosclerotic cardiovascular disease (ASCVD).* In intermediate-risk (10-year ASCVD risk >=7.5% to <20%) adults or selected borderline-risk (10-year ASCVD risk >=5% to <7.5%) adults in whom a CAC score is measured for the purpose of making a treatment decision the following recommendations have been made: If CAC=0, it is reasonable to withhold statin therapy and reassess in 5 to 10 years, as long as higher risk conditions are absent (diabetes mellitus, family history of premature CHD in first degree relatives (males <55 years; females <65 years), cigarette smoking, or LDL >=190 mg/dL). If CAC is 1 to 99, it is reasonable to initiate statin therapy for patients >=60 years of age. If CAC is >=100 or >=75th percentile, it is reasonable to initiate statin therapy at any age. Cardiology referral should be considered for patients with CAC scores >=400 or >=75th percentile. *2018 AHA/ACC/AACVPR/AAPA/ABC/ACPM/ADA/AGS/APhA/ASPC/NLA/PCNA Guideline on  the Management of Blood Cholesterol: A Report of the [employer] of Cardiology/American Heart Association Task Force on Clinical Practice Guidelines. J Am Coll Cardiol. 2019;73(24):3168-3209. Thomasene Ripple, DO [facility] The noncardiac portion of this study will be interpreted in separate report by the radiologist. Electronically Signed: By: Thomasene Ripple D.O. On: 10/01/2023 18:43       Assessment & Plan GAD (generalized anxiety disorder) Generalized Anxiety Disorder Managed with sertraline (Zoloft) 100 mg daily. Persistent anxiety symptoms include picking at arms and feeling out of breath. Reluctant to increase sertraline dosage due to past experiences. Discussed long-term lorazepam use exacerbating anxiety and cognitive decline. Employs advanced coping strategies and mindfulness techniques. Continue sertraline (Zoloft) 100  mg daily although I would like him to return to higher dose. Encourage use of heart rate monitoring during episodes of perceived tachycardia to ensure cardiac stability; CT coronary negative . Recommend mindfulness and coping strategies. Consider additional EMDR therapy for anxiety management. 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  Wife is doing well at present but has gbm. Primary insomnia Insomnia Experiences difficulty sleeping, waking at 1 AM and 3 AM. Discontinued trazodone due to vivid dreams and heart palpitations. Currently using lorazepam but concerned about dependency and cognitive effects. Rozerem (ramelteon) discussed as an alternative with minimal side effects and no dependency issues. Explained risks of long-term lorazepam use, including dementia, increased anxiety, and cognitive decline. Prescribe Rozerem (ramelteon). Advise avoiding Rozerem with melatonin or other sedatives. Continue omega-3 supplements, B vitamins, and vitamin D. Recommend replacing broken light therapy device. Hyperlipidemia, unspecified hyperlipidemia type Hyperlipidemia Elevated LDL of 176. Previous coronary calcium score was clean, but significant family history of cardiovascular events. Discussed potential benefits and risks of statin therapy; currently doesn't seem interested. Explained 10-year risk of stroke or heart attack is 7.3%, excluding family history. Encourage dietary modifications including increased intake of fish and avocados. Continue omega-3 supplements. Discuss potential benefits and risks of statin therapy, but does not yet seem interested. Reassess cholesterol levels in 1-two years. High risk medication use Will continue(s) with efforts to taper/wean lorazepam as tolerated to reduce dementia and dependency risks.   This past month far too stressful to wean at this time, but will provide rozerem to try to find  alternative. PDMP reviewed during this encounter.   General Health Maintenance Actively managing health with supplements and lifestyle modifications. Encourage continued use of omega-3 supplements, B vitamins, and vitamin D. Recommend regular physical activity such as pickleball twice a week.  Follow-up Schedule follow-up appointment before the end of February for lab panel and further evaluation of heart health.     Orders Placed During this Encounter:  No orders of the defined types were placed in this encounter.  Meds ordered this encounter  Medications   LORazepam (ATIVAN) 0.5 MG tablet    Sig: Take 1 tablet (0.5 mg total) by mouth 2 (two) times daily as needed for anxiety.    Dispense:  60 tablet    Refill:  1   ramelteon (ROZEREM) 8 MG tablet    Sig: Take 1 tablet (8 mg total) by mouth at bedtime.    Dispense:  30 tablet    Refill:  2     This document was synthesized by artificial intelligence ([employer]bridge) using HIPAA-compliant recording of the clinical interaction;   We discussed the use of AI scribe software for clinical note transcription with the patient, who gave verbal consent to  proceed.    Additional Info: This encounter employed state-of-the-art, real-time, collaborative documentation. The patient actively reviewed and assisted in updating their electronic medical record on a shared screen, ensuring transparency and facilitating joint problem-solving for the problem list, overview, and plan. This approach promotes accurate, informed care. The treatment plan was discussed and reviewed in detail, including medication safety, potential side effects, and all patient questions. We confirmed understanding and comfort with the plan. Follow-up instructions were established, including contacting the office for any concerns, returning if symptoms worsen, persist, or new symptoms develop, and precautions for potential emergency department visits.

## 2024-01-27 NOTE — Assessment & Plan Note (Signed)
Will continue(s) with efforts to taper/wean lorazepam as tolerated to reduce dementia and dependency risks.   This past month far too stressful to wean at this time, but will provide rozerem to try to find alternative. PDMP reviewed during this encounter.

## 2024-01-27 NOTE — Assessment & Plan Note (Signed)
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  Wife is doing well at present but has gbm.

## 2024-01-27 NOTE — Patient Instructions (Signed)
VISIT SUMMARY:  During today's visit, we discussed your ongoing issues with anxiety, sleep disturbances, and concerns about heart health. We reviewed your current medications and supplements, and explored alternative treatments to help manage your symptoms more effectively.  YOUR PLAN:  -INSOMNIA: Insomnia is difficulty falling or staying asleep. We discussed your current sleep issues and the side effects you experienced with trazodone. We decided to try Rozerem (ramelteon) as an alternative, which has minimal side effects and no dependency issues. Please avoid using Rozerem with melatonin or other sedatives. Continue taking your omega-3 supplements, B vitamins, and vitamin D, and consider replacing your broken light therapy device.  -GENERALIZED ANXIETY DISORDER: Generalized Anxiety Disorder is a condition characterized by persistent and excessive worry. You are currently managing this with sertraline (Zoloft) 100 mg daily. We discussed the potential negative effects of long-term lorazepam use and encouraged you to continue using mindfulness and coping strategies. Consider using heart rate monitoring during episodes of perceived rapid heart rate and explore EMDR therapy for additional anxiety management.  -HYPERTENSION: Hypertension is high blood pressure. You reported elevated blood pressure readings likely due to stress. We discussed stress management techniques and the importance of regular blood pressure monitoring. Please continue to monitor your blood pressure regularly and practice stress management techniques.  -HYPERLIPIDEMIA: Hyperlipidemia is having high levels of fats (lipids) in the blood, such as cholesterol. Your LDL cholesterol is elevated, and we discussed the potential benefits and risks of statin therapy, although you are not currently interested. We recommend dietary modifications, including increased intake of fish and avocados, and continuing omega-3 supplements. We will reassess  your cholesterol levels in two years.  -GENERAL HEALTH MAINTENANCE: You are actively managing your health with supplements and lifestyle modifications. Continue using omega-3 supplements, B vitamins, and vitamin D. Regular physical activity, such as playing pickleball twice a week, is also recommended.  INSTRUCTIONS:  Please schedule a follow-up appointment before the end of February for a lab panel and further evaluation of your heart health.

## 2024-01-27 NOTE — Assessment & Plan Note (Signed)
Generalized Anxiety Disorder Managed with sertraline (Zoloft) 100 mg daily. Persistent anxiety symptoms include picking at arms and feeling out of breath. Reluctant to increase sertraline dosage due to past experiences. Discussed long-term lorazepam use exacerbating anxiety and cognitive decline. Employs advanced coping strategies and mindfulness techniques. Continue sertraline (Zoloft) 100 mg daily although I would like him to return to higher dose. Encourage use of heart rate monitoring during episodes of perceived tachycardia to ensure cardiac stability; CT coronary negative . Recommend mindfulness and coping strategies. Consider additional EMDR therapy for anxiety management.

## 2024-02-19 IMAGING — DX DG LUMBAR SPINE 2-3V
3 series · 3 of 3 positions shown · non-contrast
Comparison: X-ray lumbar 06/23/2020.

CLINICAL DATA: Nonallopathic lesion of lumbar region. Low back pain
for 6 months.

EXAM:
LUMBAR SPINE - 2-3 VIEW

[l-spine ap]
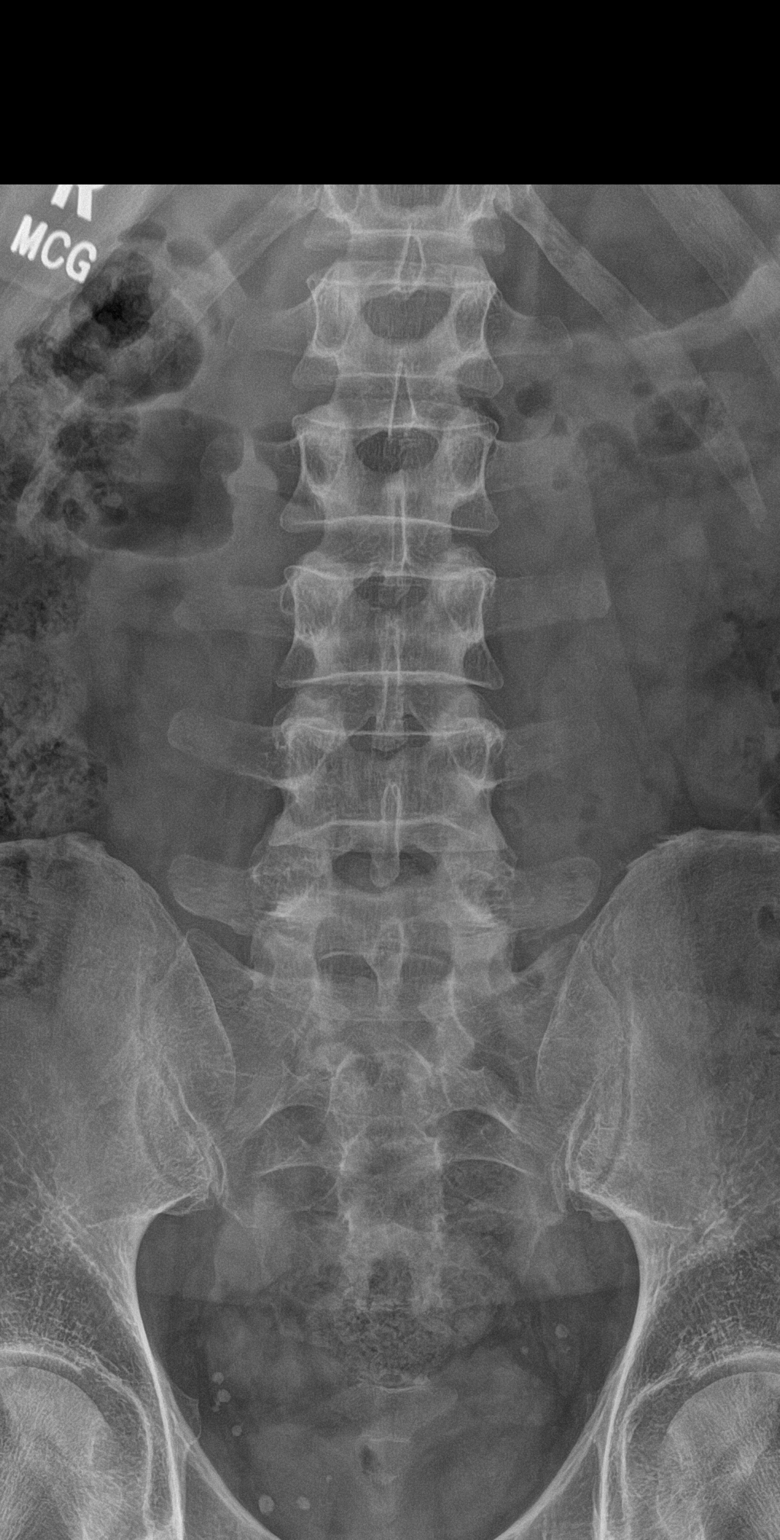

[l-spine lateral]
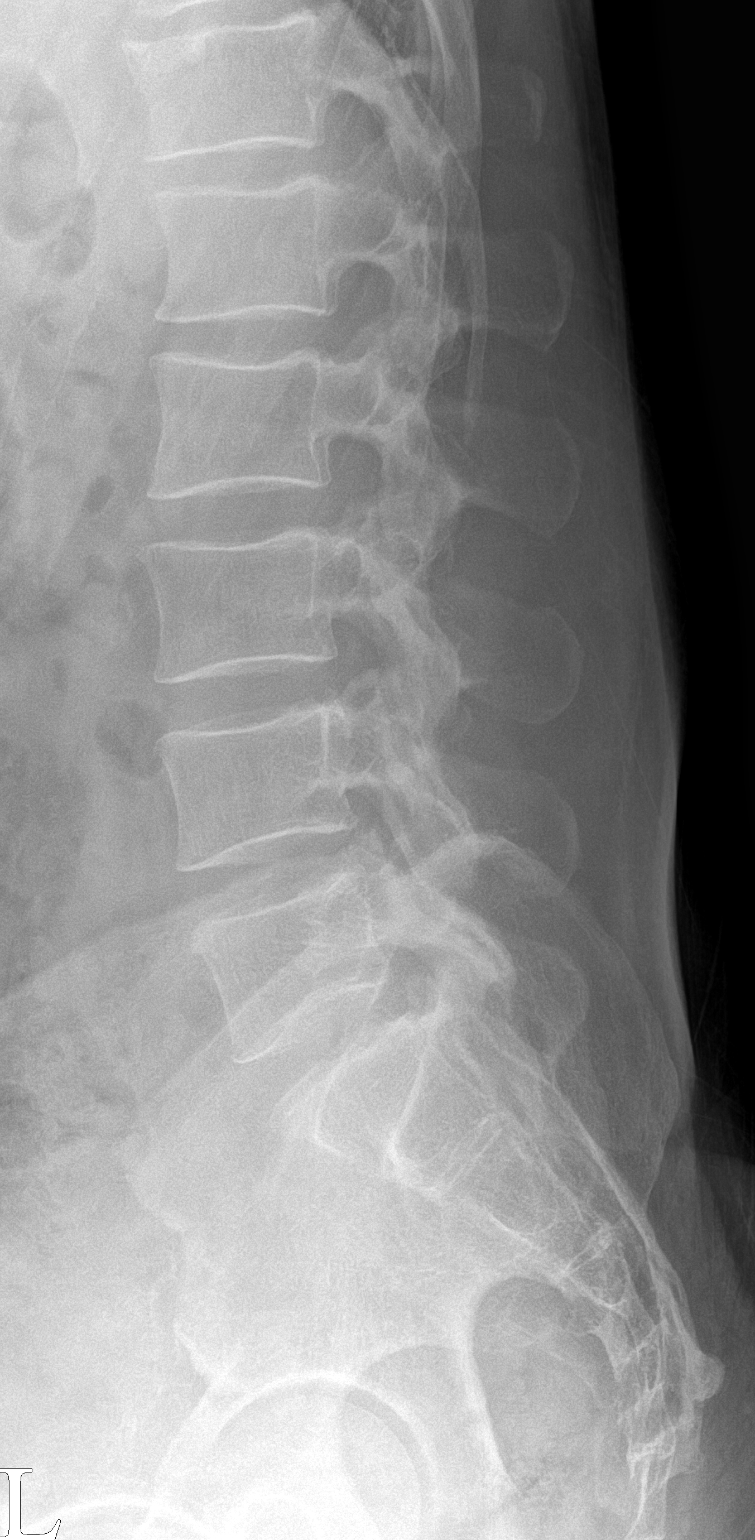

[l-spine spot]
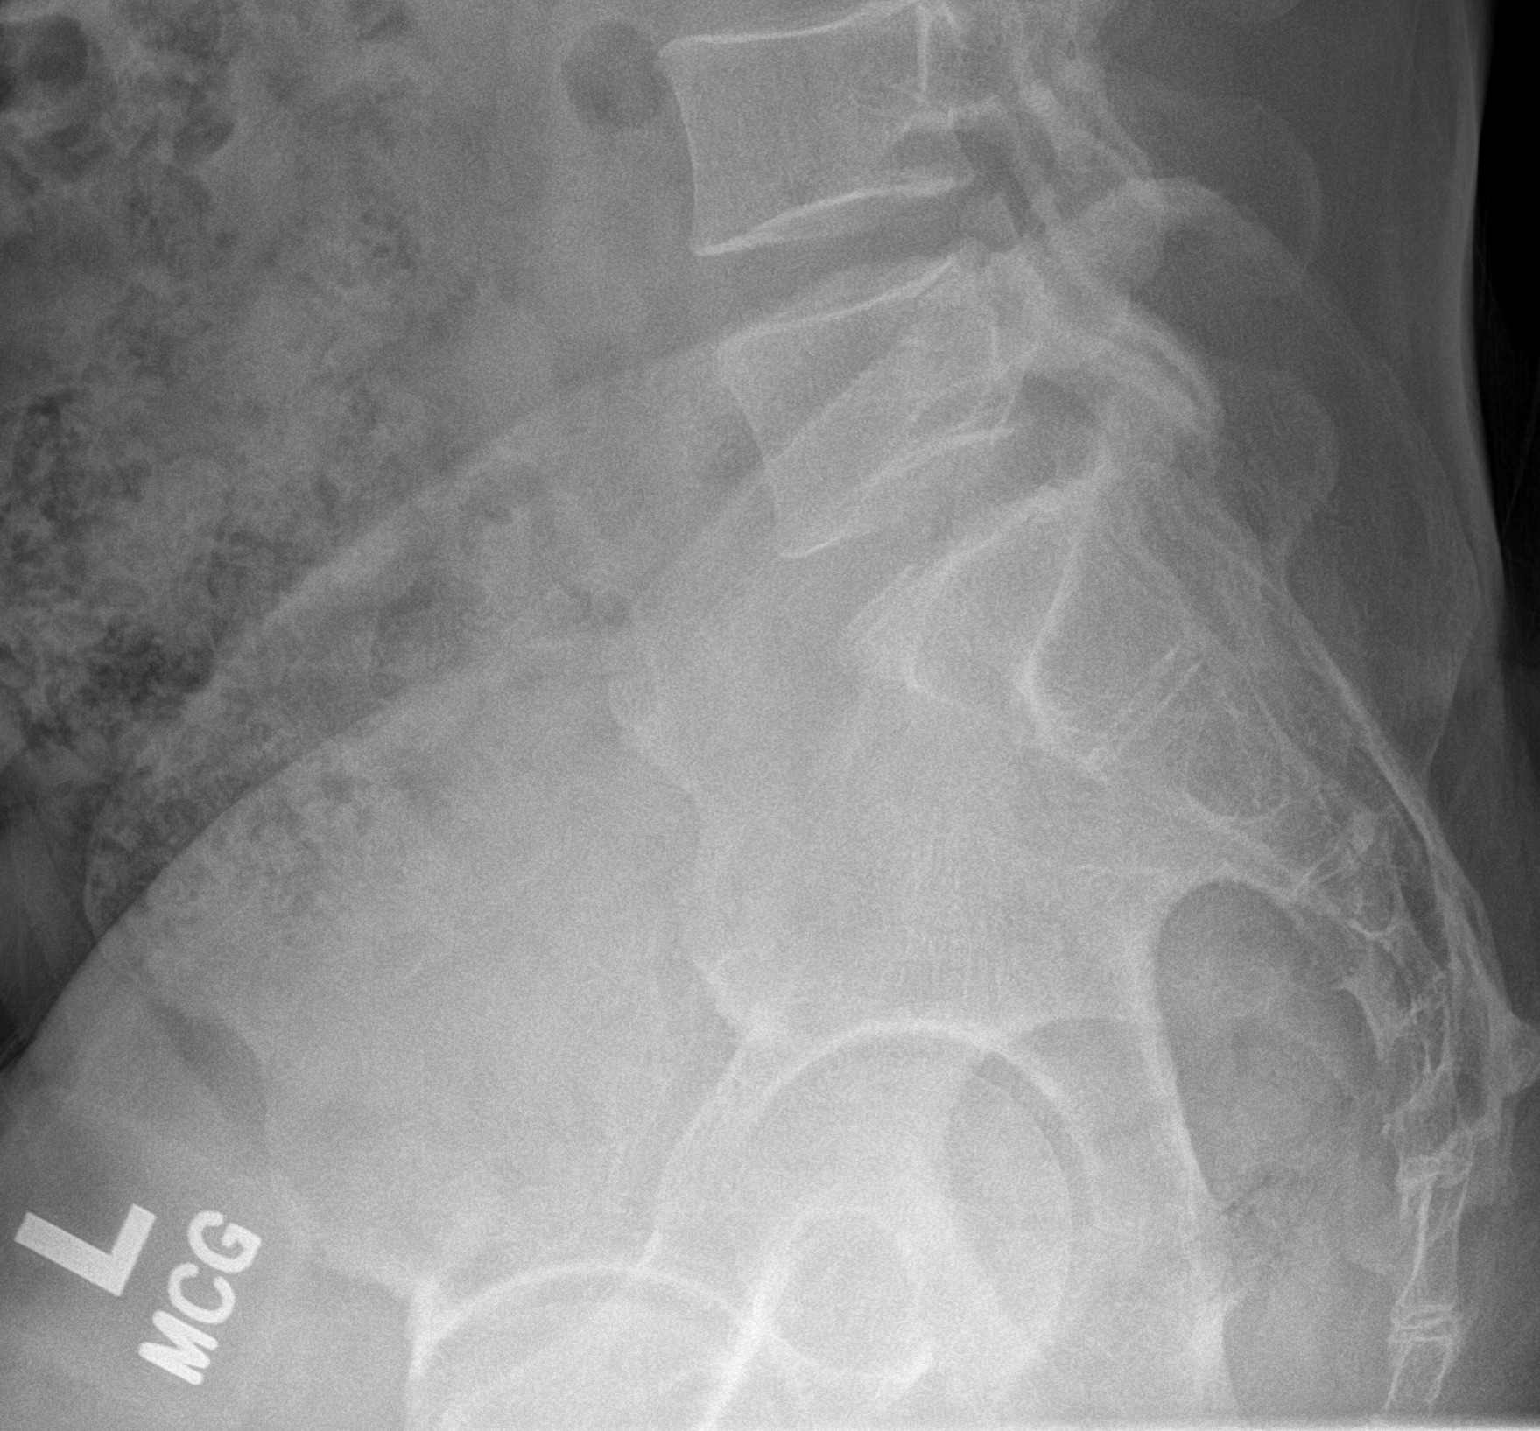

[3 of 3 positions shown; findings below may reference images not displayed]

FINDINGS: There is no evidence of lumbar spine fracture. Alignment is normal.
Intervertebral disc spaces are maintained.
IMPRESSION: Negative.

## 2024-03-06 ENCOUNTER — Encounter: Payer: No Typology Code available for payment source | Admitting: Family Medicine

## 2024-03-26 ENCOUNTER — Emergency Department (HOSPITAL_BASED_OUTPATIENT_CLINIC_OR_DEPARTMENT_OTHER)
Admission: EM | Admit: 2024-03-26 | Discharge: 2024-03-26 | Disposition: A | Discharge status: Home / Self Care | Payer: PRIVATE HEALTH INSURANCE | Attending: Emergency Medicine | Admitting: Emergency Medicine

## 2024-03-26 ENCOUNTER — Other Ambulatory Visit: Payer: Self-pay

## 2024-03-26 ENCOUNTER — Emergency Department (HOSPITAL_BASED_OUTPATIENT_CLINIC_OR_DEPARTMENT_OTHER): Payer: PRIVATE HEALTH INSURANCE

## 2024-03-26 ENCOUNTER — Encounter (HOSPITAL_BASED_OUTPATIENT_CLINIC_OR_DEPARTMENT_OTHER): Payer: Self-pay | Admitting: Emergency Medicine

## 2024-03-26 ENCOUNTER — Emergency Department (HOSPITAL_BASED_OUTPATIENT_CLINIC_OR_DEPARTMENT_OTHER): Payer: PRIVATE HEALTH INSURANCE | Admitting: Radiology

## 2024-03-26 ENCOUNTER — Other Ambulatory Visit (HOSPITAL_BASED_OUTPATIENT_CLINIC_OR_DEPARTMENT_OTHER): Payer: Self-pay

## 2024-03-26 DIAGNOSIS — I671 Cerebral aneurysm, nonruptured: Secondary | ICD-10-CM | POA: Diagnosis not present

## 2024-03-26 DIAGNOSIS — R42 Dizziness and giddiness: Secondary | ICD-10-CM | POA: Diagnosis present

## 2024-03-26 DIAGNOSIS — R0789 Other chest pain: Secondary | ICD-10-CM | POA: Diagnosis not present

## 2024-03-26 DIAGNOSIS — F419 Anxiety disorder, unspecified: Secondary | ICD-10-CM | POA: Diagnosis not present

## 2024-03-26 LAB — COMPREHENSIVE METABOLIC PANEL WITH GFR
ALT: 17 U/L (ref 0–44)
AST: 18 U/L (ref 15–41)
Albumin: 4.7 g/dL (ref 3.5–5.0)
Alkaline Phosphatase: 40 U/L (ref 38–126)
Anion gap: 7 (ref 5–15)
BUN: 14 mg/dL (ref 6–20)
CO2: 27 mmol/L (ref 22–32)
Calcium: 9.6 mg/dL (ref 8.9–10.3)
Chloride: 104 mmol/L (ref 98–111)
Creatinine, Ser: 0.93 mg/dL (ref 0.61–1.24)
GFR, Estimated: >60 mL/min (ref >60)
Glucose, Bld: 96 mg/dL (ref 70–99)
Potassium: 4.1 mmol/L (ref 3.5–5.1)
Sodium: 138 mmol/L (ref 135–145)
Total Bilirubin: 1 mg/dL (ref 0.0–1.2)
Total Protein: 7.3 g/dL (ref 6.5–8.1)

## 2024-03-26 LAB — CBC WITH DIFFERENTIAL/PLATELET
Abs Immature Granulocytes: 0.04 10*3/uL (ref 0.00–0.07)
Basophils Absolute: 0 10*3/uL (ref 0.0–0.1)
Basophils Relative: 1 %
Eosinophils Absolute: 0.4 10*3/uL (ref 0.0–0.5)
Eosinophils Relative: 8 %
HCT: 47.1 % (ref 39.0–52.0)
Hemoglobin: 16.2 g/dL (ref 13.0–17.0)
Immature Granulocytes: 1 %
Lymphocytes Relative: 24 %
Lymphs Abs: 1.3 10*3/uL (ref 0.7–4.0)
MCH: 30.2 pg (ref 26.0–34.0)
MCHC: 34.4 g/dL (ref 30.0–36.0)
MCV: 87.7 fL (ref 80.0–100.0)
Monocytes Absolute: 0.4 10*3/uL (ref 0.1–1.0)
Monocytes Relative: 9 %
Neutro Abs: 3 10*3/uL (ref 1.7–7.7)
Neutrophils Relative %: 57 %
Platelets: 236 10*3/uL (ref 150–400)
RBC: 5.37 MIL/uL (ref 4.22–5.81)
RDW: 12 % (ref 11.5–15.5)
WBC: 5.2 10*3/uL (ref 4.0–10.5)
nRBC: 0 % (ref 0.0–0.2)

## 2024-03-26 LAB — TROPONIN I (HIGH SENSITIVITY): Troponin I (High Sensitivity): <2 ng/L (ref <18)

## 2024-03-26 LAB — MAGNESIUM: Magnesium: 2 mg/dL (ref 1.7–2.4)

## 2024-03-26 MED ORDER — MECLIZINE HCL 25 MG PO TABS
25.0000 mg | ORAL_TABLET | Freq: Three times a day (TID) | ORAL | 0 refills | Status: AC | PRN
  Filled 2024-03-26: qty 30, 10d supply, fill #0

## 2024-03-26 MED ORDER — MECLIZINE HCL 25 MG PO TABS
25.0000 mg | ORAL_TABLET | Freq: Once | ORAL | Status: AC
  Administered 2024-03-26: 25 mg via ORAL
  Filled 2024-03-26: qty 1

## 2024-03-26 MED ORDER — IOHEXOL 350 MG/ML SOLN
100.0000 mL | Freq: Once | INTRAVENOUS | Status: AC | PRN
  Administered 2024-03-26: 75 mL via INTRAVENOUS

## 2024-03-26 NOTE — ED Triage Notes (Signed)
 Pt c/o LT side CP with LT arm numbness with dizziness starting last night. Speech clear, bilaterally equal

## 2024-03-26 NOTE — Discharge Instructions (Addendum)
 You were seen in the emergency department for your dizziness.  This appeared to be most consistent with vertigo which is that room spinning sensation that is worse with moving your head and neck movements.  You can take meclizine as needed for dizziness.  Your workup showed no signs of stroke heart attack or other abnormality.  It did incidentally show a small aneurysm in your brain that can be followed up with your primary doctor for monitoring.  You can follow-up with ENT for further evaluation of your vertigo as needed if you are having continued symptoms despite meclizine.  You should return to the emergency department if you are too dizzy to walk, you have numbness or weakness in one-sided body compared to the other, you are unable to talk or if you have any other new or concerning symptoms.

## 2024-03-26 NOTE — ED Provider Notes (Signed)
 [place] EMERGENCY DEPARTMENT AT [facility] Provider Note   CSN: [id number] Arrival date & time: 03/26/24  4098     History  Chief Complaint  Patient presents with   Chest Pain    Dennis [name] is a 48 y.o. male.  Patient is a 48 year old male with a past medical history of anxiety and PTSD presenting to the emergency department with chest pain and dizziness.  The patient states that last night he woke up feeling dizzy when he sat up out of bed.  He states that he felt like the room was spinning and also felt like he might pass out.  He states that he did have some associated chest pain as well as some numbness that radiated from his left shoulder down his left arm.  He states he is unsure if he still feeling any pain at this time but states that his left hand still feels tingly.  He states that when he got up this morning to get ready for the day the dizziness returned.  He states he feels both like the room spinning and like he might pass out.  He denies any associated nausea or vomiting or associated weakness.  He denies any recent illnesses.  He states he has been under significant amount of stress at home recently.  The history is provided by the patient.  Chest Pain      Home Medications Prior to Admission medications   Medication Sig Start Date End Date Taking? Authorizing Provider  meclizine (ANTIVERT) 25 MG tablet Take 1 tablet (25 mg total) by mouth 3 (three) times daily as needed for dizziness. 03/26/24  Yes Theresia Lo, [place] K, DO  LORazepam (ATIVAN) 0.5 MG tablet Take 1 tablet (0.5 mg total) by mouth at bedtime. 11/28/23   Lula Olszewski, MD  LORazepam (ATIVAN) 0.5 MG tablet Take 1 tablet (0.5 mg total) by mouth 2 (two) times daily as needed for anxiety. 01/25/24   Lula Olszewski, MD  Omega-3 Fatty Acids (FISH OIL) 1000 MG CAPS Take 2 capsules (2,000 mg total) by mouth 2 (two) times daily before lunch and supper. 11/28/23   Lula Olszewski, MD  ramelteon  (ROZEREM) 8 MG tablet Take 1 tablet (8 mg total) by mouth at bedtime. 01/25/24   Lula Olszewski, MD  sertraline (ZOLOFT) 100 MG tablet Take 1 tablet (100 mg total) by mouth daily. 11/28/23   Lula Olszewski, MD      Allergies    Patient has no known allergies.    Review of Systems   Review of Systems  Cardiovascular:  Positive for chest pain.    Physical Exam Updated Vital Signs BP (!) 123/103   Pulse 71   Temp 97.9 F (36.6 C)   Resp 15   Wt 70.3 kg   SpO2 96%   BMI 26.81 kg/m  Physical Exam Vitals and nursing note reviewed.  Constitutional:      General: He is not in acute distress.    Appearance: He is well-developed.  HENT:     Head: Normocephalic and atraumatic.  Eyes:     Extraocular Movements: Extraocular movements intact.     Pupils: Pupils are equal, round, and reactive to light.     Comments: No nystagmus  Cardiovascular:     Rate and Rhythm: Normal rate and regular rhythm.     Pulses:          Radial pulses are 2+ on the right side and 2+ on the  left side.     Heart sounds: Normal heart sounds.  Pulmonary:     Effort: Pulmonary effort is normal.     Breath sounds: Normal breath sounds.  Chest:     Chest wall: Tenderness (Mild left-sided chest wall tenderness) present.  Abdominal:     Palpations: Abdomen is soft.     Tenderness: There is no abdominal tenderness.  Musculoskeletal:        General: Normal range of motion.     Cervical back: Normal range of motion and neck supple.     Right lower leg: No edema.     Left lower leg: No edema.  Skin:    General: Skin is warm and dry.     Comments: Subjective paresthesias in the left hand otherwise sensation intact Strength 5 out of 5 in all 4 extremities, no drift in all 4 extremities Normal finger-to-nose bilaterally Normal speech  Neurological:     General: No focal deficit present.     Mental Status: He is alert and oriented to person, place, and time.     Cranial Nerves: No cranial nerve deficit.   Psychiatric:        Mood and Affect: Mood is anxious (mildly).        Behavior: Behavior normal.     ED Results / Procedures / Treatments   Labs (all labs ordered are listed, but only abnormal results are displayed) Labs Reviewed  COMPREHENSIVE METABOLIC PANEL WITH GFR  CBC WITH DIFFERENTIAL/PLATELET  MAGNESIUM  TROPONIN I (HIGH SENSITIVITY)    EKG EKG Interpretation Date/Time:  Wednesday March 26 2024 08:29:55 EDT Ventricular Rate:  82 PR Interval:  174 QRS Duration:  84 QT Interval:  352 QTC Calculation: 412 R Axis:   89  Text Interpretation: Sinus rhythm No previous ECGs available Confirmed by Elayne Snare ([id number]) on 03/26/2024 8:52:06 AM  Radiology CT Angio Head Neck W WO CM Result Date: 03/26/2024 CLINICAL DATA:  Vertigo, dizziness, left arm numbness. EXAM: CT ANGIOGRAPHY HEAD AND NECK WITH AND WITHOUT CONTRAST TECHNIQUE: Multidetector CT imaging of the head and neck was performed using the standard protocol during bolus administration of intravenous contrast. Multiplanar CT image reconstructions and MIPs were obtained to evaluate the vascular anatomy. Carotid stenosis measurements (when applicable) are obtained utilizing NASCET criteria, using the distal internal carotid diameter as the denominator. RADIATION DOSE REDUCTION: This exam was performed according to the departmental dose-optimization program which includes automated exposure control, adjustment of the mA and/or kV according to patient size and/or use of iterative reconstruction technique. CONTRAST:  75mL OMNIPAQUE IOHEXOL 350 MG/ML SOLN COMPARISON:  None Available. FINDINGS: CT HEAD FINDINGS Brain: No acute intracranial hemorrhage. No CT evidence of acute infarct. No edema, mass effect, or midline shift. The basilar cisterns are patent. Ventricles: Ventricles are normal in size and configuration. Vascular: No hyperdense vessel. Skull: No acute or aggressive finding. Sinuses/orbits: Orbits are symmetric. Mild mucosal  thickening in the bilateral ethmoid and maxillary sinuses. Other: Mastoid air cells are clear. CTA NECK FINDINGS Aortic arch: Standard configuration of the aortic arch. Imaged portion shows no evidence of aneurysm or dissection. No significant stenosis of the major arch vessel origins. Pulmonary arteries: As permitted by contrast timing, there are no filling defects in the visualized pulmonary arteries. Subclavian arteries: The subclavian arteries are patent bilaterally. Right carotid system: No evidence of dissection, stenosis (50% or greater), or occlusion. Left carotid system: No evidence of dissection, stenosis (50% or greater), or occlusion. Vertebral arteries: Codominant. No evidence of  dissection, stenosis (50% or greater), or occlusion. Skeleton: No acute findings. Mild degenerative changes in the cervical spine with disc space narrowing greatest at C5-6 and C6-7. Chronic appearing mild deformities of the T4 and T5 superior endplates. Other neck: The visualized airway is patent. No cervical lymphadenopathy. Upper chest: Visualized lung apices are clear. Review of the MIP images confirms the above findings CTA HEAD FINDINGS ANTERIOR CIRCULATION: The intracranial ICAs are patent bilaterally. There is minimal atherosclerosis at the anterior genu of the right cavernous ICA without stenosis. No significant stenosis, proximal occlusion, aneurysm, or vascular malformation. MCAs: Patent bilaterally. 1.5 mm anteriorly and superiorly directed outpouching at the left MCA bifurcation concerning for small aneurysm. ACAs: The anterior cerebral arteries are patent bilaterally. POSTERIOR CIRCULATION: No significant stenosis, proximal occlusion, aneurysm, or vascular malformation. PCAs: The posterior cerebral arteries are patent bilaterally. Pcomm: Not well visualized. SCAs: The superior cerebellar arteries are patent bilaterally. Basilar artery: Patent. Fenestrated appearance of the proximal basilar artery. AICAs: Patent  PICAs: Patent Vertebral arteries: The intracranial vertebral arteries are patent. Venous sinuses: As permitted by contrast timing, patent. Anatomic variants: None Review of the MIP images confirms the above findings IMPRESSION: No large vessel occlusion. No high-grade stenosis of the arteries in the head and neck. 1.5 mm anteriorly and superiorly directed outpouching at the left MCA bifurcation concerning for small aneurysm. No CT evidence of acute intracranial abnormality. Electronically Signed   By: Emily Filbert M.D.   On: 03/26/2024 11:37   DG Chest 2 View Result Date: 03/26/2024 CLINICAL DATA:  Chest pain. EXAM: CHEST - 2 VIEW COMPARISON:  Chest radiograph dated 08/31/2019. FINDINGS: The heart size and mediastinal contours are within normal limits. Both lungs are clear. No pleural effusion or pneumothorax. No acute osseous abnormality. IMPRESSION: No acute cardiopulmonary findings. Electronically Signed   By: Hart Robinsons M.D.   On: 03/26/2024 10:50    Procedures Procedures    Medications Ordered in ED Medications  iohexol (OMNIPAQUE) 350 MG/ML injection 100 mL (75 mLs Intravenous Contrast Given 03/26/24 1025)  meclizine (ANTIVERT) tablet 25 mg (25 mg Oral Given 03/26/24 1228)    ED Course/ Medical Decision Making/ A&P Clinical Course as of 03/26/24 1254  Wed Mar 26, 2024  0948 Initial troponin negative, symptoms ongoing since last night so single troponin is sufficient. Remainder of labs within normal range. Imaging and orthostatics pending. [VK]  1147 CT negative for stroke or occlusion, possible small aneurysm and will be recommended outpatient follow up. Orthostatic vitals normal, but was symptomatic upon standing. Higher suspicion of vertigo however and will be given meclizine for symptomatic management. [VK]    Clinical Course User Index [VK] Rexford Maus, DO                                 Medical Decision Making This patient presents to the ED with chief complaint(s)  of CP, dizziness, L hand numbness with pertinent past medical history of anxiety, PTSD which further complicates the presenting complaint. The complaint involves an extensive differential diagnosis and also carries with it a high risk of complications and morbidity.    The differential diagnosis includes arrhythmia, anemia, dehydration, electrolyte abnormality, ACS, equal pulses bilaterally with minimal pain and fairly normal blood pressure making a dissection unlikely, peripheral versus central vertigo, orthostatic hypotension  Additional history obtained: Additional history obtained from N/A Records reviewed Primary Care Documents  ED Course and Reassessment: On patient's arrival he  is hemodynamically stable in no acute distress.  Does have subjective paresthesias in the left hand but no other focal neurologic deficits, no obvious nystagmus.  With the patient's dizziness and associated neurodeficits will have CTA to evaluate for possible central vertigo.  EKG on arrival showed normal sinus rhythm without acute ischemic changes.  Will do labs including troponin and electrolytes.  He declined any meclizine at this time.  Will be closely reassessed.  Independent labs interpretation:  The following labs were independently interpreted: within normal range  Independent visualization of imaging: - I independently visualized the following imaging with scope of interpretation limited to determining acute life threatening conditions related to emergency care: CTA Head/Neck, CXR, which revealed possible small aneurysm, otherwise no acute disease  Consultation: - Consulted or discussed management/test interpretation w/ external professional: N/A  Consideration for admission or further workup: Patient has no emergent conditions requiring admission or further work-up at this time and is stable for discharge home with primary care follow-up  Social Determinants of health: N/A    Amount and/or Complexity  of Data Reviewed Labs: ordered. Radiology: ordered.  Risk Prescription drug management.          Final Clinical Impression(s) / ED Diagnoses Final diagnoses:  Vertigo  Brain aneurysm    Rx / DC Orders ED Discharge Orders          Ordered    meclizine (ANTIVERT) 25 MG tablet  3 times daily PRN        03/26/24 1252              Rexford Maus, DO 03/26/24 1254

## 2024-08-02 ENCOUNTER — Emergency Department (HOSPITAL_BASED_OUTPATIENT_CLINIC_OR_DEPARTMENT_OTHER): Payer: PRIVATE HEALTH INSURANCE

## 2024-08-02 ENCOUNTER — Encounter (HOSPITAL_BASED_OUTPATIENT_CLINIC_OR_DEPARTMENT_OTHER): Payer: Self-pay

## 2024-08-02 ENCOUNTER — Other Ambulatory Visit: Payer: Self-pay

## 2024-08-02 ENCOUNTER — Emergency Department (HOSPITAL_BASED_OUTPATIENT_CLINIC_OR_DEPARTMENT_OTHER)
Admission: EM | Admit: 2024-08-02 | Discharge: 2024-08-02 | Disposition: A | Discharge status: Home / Self Care | Payer: Self-pay | Attending: Emergency Medicine | Admitting: Emergency Medicine

## 2024-08-02 DIAGNOSIS — Z7901 Long term (current) use of anticoagulants: Secondary | ICD-10-CM | POA: Insufficient documentation

## 2024-08-02 DIAGNOSIS — M79605 Pain in left leg: Secondary | ICD-10-CM | POA: Diagnosis present

## 2024-08-02 NOTE — Discharge Instructions (Addendum)
 It was a pleasure taking care of you in the Ed today.  Continue taking your blood thinner  Follow up with your primary care provider  Return for new or worsening symptoms such as chest pain, shortness of breath, discoloration or numbness to the leg

## 2024-08-02 NOTE — ED Provider Notes (Signed)
 [place] EMERGENCY DEPARTMENT AT [facility] Provider Note   CSN: [id number] Arrival date & time: 08/02/24  1730    Patient presents with: Leg Pain (left)   Dennis [name] is a 48 y.o. male here for evaluation of left leg pain.  He drove from [place] to [place] and then to [place]  due to his wife's cancer treatment.  Developed posterior left calf pain.  He was seen in the ED on 8//25 diagnosed with DVT started on Eliquis.  He has been compliant with his medications.  He states today he woke up with left thigh pain.  He has no chest pain, shortness of breath, numbness, weakness, syncope.  No recent falls or injuries   HPI     Prior to Admission medications   Medication Sig Start Date End Date Taking? Authorizing Provider  LORazepam  (ATIVAN ) 0.5 MG tablet Take 1 tablet (0.5 mg total) by mouth at bedtime. 11/28/23   Jesus Bernardino MATSU, MD  LORazepam  (ATIVAN ) 0.5 MG tablet Take 1 tablet (0.5 mg total) by mouth 2 (two) times daily as needed for anxiety. 01/25/24   Jesus Bernardino MATSU, MD  meclizine  (ANTIVERT ) 25 MG tablet Take 1 tablet (25 mg total) by mouth 3 (three) times daily as needed for dizziness. 03/26/24   Kingsley, Victoria K, DO  Omega-3 Fatty Acids (FISH OIL ) 1000 MG CAPS Take 2 capsules (2,000 mg total) by mouth 2 (two) times daily before lunch and supper. 11/28/23   Jesus Bernardino MATSU, MD  ramelteon  (ROZEREM ) 8 MG tablet Take 1 tablet (8 mg total) by mouth at bedtime. 01/25/24   Jesus Bernardino MATSU, MD  sertraline  (ZOLOFT ) 100 MG tablet Take 1 tablet (100 mg total) by mouth daily. 11/28/23   Jesus Bernardino MATSU, MD    Allergies: Patient has no known allergies.    Review of Systems  Constitutional: Negative.   HENT: Negative.    Respiratory: Negative.    Cardiovascular: Negative.   Gastrointestinal: Negative.   Genitourinary: Negative.   Musculoskeletal:        Left leg pain  Neurological: Negative.   All other systems reviewed and are negative.   Updated Vital  Signs BP 135/85 (BP Location: Right Arm)   Pulse 74   Temp 98.7 F (37.1 C) (Oral)   Resp 16   Ht 5' 4 (1.626 m)   Wt 70.3 kg   SpO2 98%   BMI 26.60 kg/m   Physical Exam Vitals and nursing note reviewed.  Constitutional:      General: He is not in acute distress.    Appearance: He is well-developed. He is not ill-appearing, toxic-appearing or diaphoretic.  HENT:     Head: Atraumatic.  Eyes:     Pupils: Pupils are equal, round, and reactive to light.  Cardiovascular:     Rate and Rhythm: Normal rate and regular rhythm.     Pulses: Normal pulses.          Radial pulses are 2+ on the right side and 2+ on the left side.       Dorsalis pedis pulses are 2+ on the right side and 2+ on the left side.     Heart sounds: Normal heart sounds.  Pulmonary:     Effort: Pulmonary effort is normal. No respiratory distress.  Abdominal:     General: There is no distension.     Palpations: Abdomen is soft.  Musculoskeletal:        General: Normal range of motion.  Cervical back: Normal range of motion and neck supple.     Comments: Compartment soft, no bony tenderness, nontender calves bilaterally  Skin:    General: Skin is warm and dry.     Capillary Refill: Capillary refill takes less than 2 seconds.     Comments: No edema, erythema or warmth.  No fluctuance or induration.  Neurological:     General: No focal deficit present.     Mental Status: He is alert and oriented to person, place, and time.     (all labs ordered are listed, but only abnormal results are displayed) Labs Reviewed - No data to display  EKG: None  Radiology: US  Venous Img Lower Unilateral Left Result Date: 08/02/2024 CLINICAL DATA:  History of known posterior calf vein thrombus on recent ultrasound with increasing thigh pain, initial encounter EXAM: LEFT LOWER EXTREMITY VENOUS DOPPLER ULTRASOUND TECHNIQUE: Gray-scale sonography with graded compression, as well as color Doppler and duplex ultrasound were  performed to evaluate the lower extremity deep venous systems from the level of the common femoral vein and including the common femoral, femoral, profunda femoral, popliteal and calf veins including the posterior tibial, peroneal and gastrocnemius veins when visible. The superficial great saphenous vein was also interrogated. Spectral Doppler was utilized to evaluate flow at rest and with distal augmentation maneuvers in the common femoral, femoral and popliteal veins. COMPARISON:  None Available. FINDINGS: Contralateral Common Femoral Vein: Respiratory phasicity is normal and symmetric with the symptomatic side. No evidence of thrombus. Normal compressibility. Common Femoral Vein: No evidence of thrombus. Normal compressibility, respiratory phasicity and response to augmentation. Saphenofemoral Junction: No evidence of thrombus. Normal compressibility and flow on color Doppler imaging. Profunda Femoral Vein: No evidence of thrombus. Normal compressibility and flow on color Doppler imaging. Femoral Vein: No evidence of thrombus. Normal compressibility, respiratory phasicity and response to augmentation. Popliteal Vein: No evidence of thrombus. Normal compressibility, respiratory phasicity and response to augmentation. Calf Veins: No evidence of thrombus. Normal compressibility and flow on color Doppler imaging. Superficial Great Saphenous Vein: No evidence of thrombus. Normal compressibility. Venous Reflux:  None. Other Findings:  None. IMPRESSION: No evidence of deep venous thrombosis. The posterior calf veins seen on prior exam is not well appreciated on this study. No evidence of thrombus progression is noted. Electronically Signed   By: Oneil Devonshire M.D.   On: 08/02/2024 19:37     Procedures   Medications Ordered in the ED - No data to display  48 year old here for evaluation of left leg pain.  Recent diagnosis of DVT on 07/28/24 to his left calf after extended road trip.  He comes in today that now he is  having some thigh pain.  No chest pain or shortness of breath.  He is without tachycardia, tachypnea or hypoxia.  Has been compliant with his Eliquis.  Shared decision making for patient.  Will get repeat ultrasound.  Imaging personally viewed interpreted Ultrasound without acute DVT  Discussed results with patient.  Will have him continue his anticoagulation.  No chest pain, shortness of breath, low suspicion for PE.  His compartments are soft-low suspicion for compartment syndrome, no bony pain to suggest fracture, dislocation.  Symptoms not consistent with rhabdomyolysis.  No evidence of ischemia or infection on exam.  Follow-up outpatient, return for any worsening symptoms  The patient has been appropriately medically screened and/or stabilized in the ED. I have low suspicion for any other emergent medical condition which would require further screening, evaluation or treatment in the  ED or require inpatient management.  Patient is hemodynamically stable and in no acute distress.  Patient able to ambulate in department prior to ED.  Evaluation does not show acute pathology that would require ongoing or additional emergent interventions while in the emergency department or further inpatient treatment.  I have discussed the diagnosis with the patient and answered all questions.  Pain is been managed while in the emergency department and patient has no further complaints prior to discharge.  Patient is comfortable with plan discussed in room and is stable for discharge at this time.  I have discussed strict return precautions for returning to the emergency department.  Patient was encouraged to follow-up with PCP/specialist refer to at discharge.                                   Medical Decision Making Amount and/or Complexity of Data Reviewed External Data Reviewed: labs, radiology and notes. Radiology: ordered and independent interpretation performed. Decision-making details documented in ED  Course.  Risk OTC drugs. Decision regarding hospitalization. Diagnosis or treatment significantly limited by social determinants of health.       Final diagnoses:  Pain of left lower extremity    ED Discharge Orders     None          Twinkle Sockwell A, PA-C 08/02/24 1958    Mannie Pac T, DO 08/03/24 1532

## 2024-08-02 NOTE — ED Triage Notes (Signed)
 Arrives POV with complaints of left upper thigh pain. Patient was diagnosed with a DVT to his left calf earlier this week and he is now having the same tenderness to his left thigh.  On Eliquis.

## 2024-08-12 ENCOUNTER — Ambulatory Visit: Payer: PRIVATE HEALTH INSURANCE | Admitting: Internal Medicine

## 2024-08-13 ENCOUNTER — Encounter: Payer: Self-pay | Admitting: Internal Medicine

## 2024-08-13 ENCOUNTER — Telehealth (INDEPENDENT_AMBULATORY_CARE_PROVIDER_SITE_OTHER): Payer: PRIVATE HEALTH INSURANCE | Admitting: Internal Medicine

## 2024-08-13 DIAGNOSIS — I82462 Acute embolism and thrombosis of left calf muscular vein: Secondary | ICD-10-CM | POA: Diagnosis not present

## 2024-08-13 DIAGNOSIS — I671 Cerebral aneurysm, nonruptured: Secondary | ICD-10-CM | POA: Diagnosis not present

## 2024-08-13 MED ORDER — APIXABAN 5 MG PO TABS: ORAL_TABLET | ORAL | 2 refills | Status: AC

## 2024-08-13 NOTE — Assessment & Plan Note (Signed)
 A blood clot in the left calf muscular vein likely resulted from prolonged driving. Eliquis  resolved the clot quickly, indicating a strong correlation with the driving event. Standard treatment duration is three months, but the cost of Eliquis  is a concern due to lack of prescription insurance. There is a risk of clot recurrence if treatment is stopped prematurely. Continue Eliquis  5 mg twice daily for three months. Explore a patient assistance program through [employer] for Eliquis . A pharmacist will contact him to discuss cost-effective options. Send the Eliquis  prescription to [facility] on [address]. Educate on repositioning during long drives to prevent future clots. Consider future testing for clotting disorders due to family history and young age for clot development.

## 2024-08-13 NOTE — Progress Notes (Signed)
 ====================================  [facility]: [phone]   --  Virtual Video Medical Office Visit --  Patient: Dennis [name]      Age: 48 y.o.       Sex:  male  Date:   08/13/2024 Today's Healthcare Provider: Bernardino KANDICE Cone, MD  ====================================    Chief Complaint/Reason For Visit: DVT and Aneurysm  Chart reviewed: has Right lumbar radiculopathy; Nonallopathic lesion of thoracic region; Nonallopathic lesion of lumbar region; Nonallopathic lesion of sacral region; Loss of transverse plantar arch; Ulnar nerve compression, left; Scapular dyskinesis; Metatarsal stress fracture of left foot; Hypertriglyceridemia; Caregiver burden; GAD (generalized anxiety disorder); Mixed hyperlipidemia; High risk medication use; Psychophysiological insomnia; Recurrent major depressive disorder, in full remission (HCC); PTSD (post-traumatic stress disorder); Acute deep vein thrombosis (DVT) of calf muscle vein of left lower extremity (HCC); and Aneurysm of middle cerebral artery on their problem list..  Chart reviewed:  has a past medical history of Acute colitis (08/2019), Allergy, Asthma, GAD (generalized anxiety disorder), Hypercholesterolemia (01/2019), OCD (obsessive compulsive disorder), Seasonal allergies, and Vitamin D  deficiency. Discussed the use of AI scribe software for clinical note transcription with the patient, who gave verbal consent to proceed.  History of Present Illness 48 year old male who presents with a history of a blood clot in the left leg.  He experienced a blood clot in his left leg, identified during a hospital visit while accompanying his wife. Initially, he noticed a painful spot in his leg, which was confirmed to be a blood clot. He was prescribed Eliquis , taking two pills in the morning and two at night for the first week, followed by one pill in the morning and one at night. He received a total of 74 pills and  currently has about 20 left. He attributes the clot to prolonged driving during trips to [place] and [place] .  He mentions a subsequent painful spot in his quad, which was evaluated but determined to be non-concerning. The initial clot symptoms have resolved, and he continues to take Eliquis , although he faces financial challenges due to lack of prescription insurance coverage. He has no prior history of blood clots and is concerned about the cost of Eliquis , as his current insurance plan does not cover prescriptions.  In his family history, his father had a condition referred to as 'thick blood disease' and died suddenly, though he was not known to have blood clots.  He reports that a prior CT scan showed what may be a small aneurysm, described to him as a 1.5 mm outpouching at the MCA bifurcation. He has not experienced headaches and is monitoring his blood pressure, which was noted to be high during a recent ER visit but normalized subsequently. No headaches related to the suspected aneurysm.  Medications reviewed Current Outpatient Medications on File Prior to Visit  Medication Sig   LORazepam  (ATIVAN ) 0.5 MG tablet Take 1 tablet (0.5 mg total) by mouth at bedtime.   LORazepam  (ATIVAN ) 0.5 MG tablet Take 1 tablet (0.5 mg total) by mouth 2 (two) times daily as needed for anxiety.   meclizine  (ANTIVERT ) 25 MG tablet Take 1 tablet (25 mg total) by mouth 3 (three) times daily as needed for dizziness.   Omega-3 Fatty Acids (FISH OIL ) 1000 MG CAPS Take 2 capsules (2,000 mg total) by mouth 2 (two) times daily before lunch and supper.   ramelteon  (ROZEREM ) 8 MG tablet Take 1 tablet (8 mg total) by mouth at bedtime.   sertraline  (ZOLOFT )  100 MG tablet Take 1 tablet (100 mg total) by mouth daily.   No current facility-administered medications on file prior to visit.  There are no discontinued medications.      Virtual Physical Exam: General Appearance:  Well Developed, Well Nourished, No Acute  Distress by Limited Video Assessment Pulmonary:  No Respiratory Distress Apparent. Normal Work of Breathing.   Neurological:  Awake, Alert. No Obvious Focal Neurological Deficits or Cognitive Impairments.  Sensorium Seems Unclouded. Psychiatric:  Appropriate Mood, Pleasant Demeanor, Calm, Articulate, Good Mood   RESULTS   US  Venous Img Lower Unilateral Left Result Date: 08/02/2024 CLINICAL DATA:  History of known posterior calf vein thrombus on recent ultrasound with increasing thigh pain, initial encounter EXAM: LEFT LOWER EXTREMITY VENOUS DOPPLER ULTRASOUND TECHNIQUE: Gray-scale sonography with graded compression, as well as color Doppler and duplex ultrasound were performed to evaluate the lower extremity deep venous systems from the level of the common femoral vein and including the common femoral, femoral, profunda femoral, popliteal and calf veins including the posterior tibial, peroneal and gastrocnemius veins when visible. The superficial great saphenous vein was also interrogated. Spectral Doppler was utilized to evaluate flow at rest and with distal augmentation maneuvers in the common femoral, femoral and popliteal veins. COMPARISON:  None Available. FINDINGS: Contralateral Common Femoral Vein: Respiratory phasicity is normal and symmetric with the symptomatic side. No evidence of thrombus. Normal compressibility. Common Femoral Vein: No evidence of thrombus. Normal compressibility, respiratory phasicity and response to augmentation. Saphenofemoral Junction: No evidence of thrombus. Normal compressibility and flow on color Doppler imaging. Profunda Femoral Vein: No evidence of thrombus. Normal compressibility and flow on color Doppler imaging. Femoral Vein: No evidence of thrombus. Normal compressibility, respiratory phasicity and response to augmentation. Popliteal Vein: No evidence of thrombus. Normal compressibility, respiratory phasicity and response to augmentation. Calf Veins: No evidence of  thrombus. Normal compressibility and flow on color Doppler imaging. Superficial Great Saphenous Vein: No evidence of thrombus. Normal compressibility. Venous Reflux:  None. Other Findings:  None. IMPRESSION: No evidence of deep venous thrombosis. The posterior calf veins seen on prior exam is not well appreciated on this study. No evidence of thrombus progression is noted. Electronically Signed   By: Oneil Devonshire M.D.   On: 08/02/2024 19:37   CT angiogram 03/26/2024 Narrative & Impression  CLINICAL DATA:  Vertigo, dizziness, left arm numbness.   EXAM: CT ANGIOGRAPHY HEAD AND NECK WITH AND WITHOUT CONTRAST   TECHNIQUE: Multidetector CT imaging of the head and neck was performed using the standard protocol during bolus administration of intravenous contrast. Multiplanar CT image reconstructions and MIPs were obtained to evaluate the vascular anatomy. Carotid stenosis measurements (when applicable) are obtained utilizing NASCET criteria, using the distal internal carotid diameter as the denominator.   RADIATION DOSE REDUCTION: This exam was performed according to the departmental dose-optimization program which includes automated exposure control, adjustment of the mA and/or kV according to patient size and/or use of iterative reconstruction technique.   CONTRAST:  75mL OMNIPAQUE  IOHEXOL  350 MG/ML SOLN   COMPARISON:  None Available.   FINDINGS: CT HEAD FINDINGS   Brain: No acute intracranial hemorrhage. No CT evidence of acute infarct. No edema, mass effect, or midline shift. The basilar cisterns are patent.   Ventricles: Ventricles are normal in size and configuration.   Vascular: No hyperdense vessel.   Skull: No acute or aggressive finding.   Sinuses/orbits: Orbits are symmetric. Mild mucosal thickening in the bilateral ethmoid and maxillary sinuses.   Other: Mastoid air cells  are clear.   CTA NECK FINDINGS   Aortic arch: Standard configuration of the aortic arch.  Imaged portion shows no evidence of aneurysm or dissection. No significant stenosis of the major arch vessel origins.   Pulmonary arteries: As permitted by contrast timing, there are no filling defects in the visualized pulmonary arteries.   Subclavian arteries: The subclavian arteries are patent bilaterally.   Right carotid system: No evidence of dissection, stenosis (50% or greater), or occlusion.   Left carotid system: No evidence of dissection, stenosis (50% or greater), or occlusion.   Vertebral arteries: Codominant. No evidence of dissection, stenosis (50% or greater), or occlusion.   Skeleton: No acute findings. Mild degenerative changes in the cervical spine with disc space narrowing greatest at C5-6 and C6-7. Chronic appearing mild deformities of the T4 and T5 superior endplates.   Other neck: The visualized airway is patent. No cervical lymphadenopathy.   Upper chest: Visualized lung apices are clear.   Review of the MIP images confirms the above findings   CTA HEAD FINDINGS   ANTERIOR CIRCULATION: The intracranial ICAs are patent bilaterally. There is minimal atherosclerosis at the anterior genu of the right cavernous ICA without stenosis. No significant stenosis, proximal occlusion, aneurysm, or vascular malformation.   MCAs: Patent bilaterally. 1.5 mm anteriorly and superiorly directed outpouching at the left MCA bifurcation concerning for small aneurysm.   ACAs: The anterior cerebral arteries are patent bilaterally.   POSTERIOR CIRCULATION: No significant stenosis, proximal occlusion, aneurysm, or vascular malformation.   PCAs: The posterior cerebral arteries are patent bilaterally.   Pcomm: Not well visualized.   SCAs: The superior cerebellar arteries are patent bilaterally.   Basilar artery: Patent. Fenestrated appearance of the proximal basilar artery.   AICAs: Patent   PICAs: Patent   Vertebral arteries: The intracranial vertebral arteries  are patent.   Venous sinuses: As permitted by contrast timing, patent.   Anatomic variants: None   Review of the MIP images confirms the above findings   IMPRESSION: No large vessel occlusion.   No high-grade stenosis of the arteries in the head and neck.   1.5 mm anteriorly and superiorly directed outpouching at the left MCA bifurcation concerning for small aneurysm.   No CT evidence of acute intracranial abnormality.       ASSESSMENT & PLAN   Assessment & Plan Acute deep vein thrombosis (DVT) of calf muscle vein of left lower extremity (HCC) A blood clot in the left calf muscular vein likely resulted from prolonged driving. Eliquis  resolved the clot quickly, indicating a strong correlation with the driving event. Standard treatment duration is three months, but the cost of Eliquis  is a concern due to lack of prescription insurance. There is a risk of clot recurrence if treatment is stopped prematurely. Continue Eliquis  5 mg twice daily for three months. Explore a patient assistance program through [employer] for Eliquis . A pharmacist will contact him to discuss cost-effective options. Send the Eliquis  prescription to [facility] on [address]. Educate on repositioning during long drives to prevent future clots. Consider future testing for clotting disorders due to family history and young age for clot development. Aneurysm of middle cerebral artery A 1.5 mm outpouching at the MCA bifurcation was identified on a CT scan, suggesting a small aneurysm. Surgical intervention is not indicated until the aneurysm reaches 7 mm. The aneurysm is asymptomatic with a low risk of rupture. Regular monitoring is recommended to track changes in size. Order an MRA of the brain in December to  monitor aneurysm size. Monitor blood pressure regularly and report any elevations. Educate on the low risk of rupture and the importance of monitoring.   ORDER ASSOCIATIONS  #   DIAGNOSIS / CONDITION ICD-10  ENCOUNTER ORDER     ICD-10-CM   1. Acute deep vein thrombosis (DVT) of calf muscle vein of left lower extremity (HCC)  I82.462 apixaban  (ELIQUIS ) 5 MG TABS tablet    AMB Referral [facility] Care Management    2. Aneurysm of middle cerebral artery  I67.1 MR Angiogram Neck W Wo Contrast     Meds ordered this encounter  Medications   apixaban  (ELIQUIS ) 5 MG TABS tablet    Sig: Take 2 tablets (10mg ) twice daily for 7 days, then 1 tablet (5mg ) twice daily    Dispense:  60 tablet    Refill:  2    MR Angiogram Neck W Wo Contrast       Comments: Follow up 1.5 mm mca aneurysm on 03/2024 CT angiogram     Medical Decision Making: 1 or more chronic illnesses with exacerbation,  progression, or side effects of treatment 2 or more stable chronic illnesses Prescription drug management      Treatment plan discussed and reviewed in detail. Explained medication safety and potential side effects.  Answered all patient questions and confirmed understanding and comfort with the plan. Encouraged patient to contact our office if they have any questions or concerns.  Agreed on patient coming for a sooner office visit if symptoms worsen, persist, or new symptoms develop. Discussed precautions in case of needing to visit the Emergency Department.    ----------------------------------------------------- Attestation:  Today's Healthcare Provider Bernardino KANDICE Cone, MD was located at office at [facility] [address] [place] [address].  The patient was located at home. All video encounter participant identities and locations confirmed visually and verbally.Today's Telemedicine visit was conducted via synchronous Video after consent for telemedicine was obtained:  Video connection was never lost    This document was transcribed and resynthesized, in part, by artificial intelligence ([employer]) using HIPAA-compliant recording of the clinical interaction;   We have discussed the our use  of AI scribe software for clinical note transcription with the patient, who has given verbal consent to proceed.

## 2024-08-13 NOTE — Patient Instructions (Signed)
 It was a pleasure seeing you today! Your health and satisfaction are our top priorities.  Bernardino Cone, MD  VISIT SUMMARY: Today, we discussed your history of a blood clot in your left leg and your current treatment with Eliquis . We also reviewed a previous CT scan that showed a small aneurysm in your brain. We talked about the importance of continuing your medication and monitoring your condition.  YOUR PLAN: -ACUTE EMBOLISM AND THROMBOSIS OF LEFT CALF MUSCULAR VEIN: This is a blood clot in the vein of your left calf, likely caused by prolonged driving. You should continue taking Eliquis  5 mg twice daily for three months to prevent the clot from coming back. We will explore a patient assistance program to help with the cost of Eliquis , and a pharmacist will contact you to discuss cost-effective options. Your prescription will be sent to [facility] on [address]. To prevent future clots, make sure to reposition yourself during long drives. We may consider future testing for clotting disorders due to your family history and young age for clot development.  -UNRUPTURED MIDDLE CEREBRAL ARTERY ANEURYSM: This is a small bulge in one of the arteries in your brain. It is currently too small to require surgery and has a low risk of rupturing. We will monitor it regularly to track any changes in size. An MRA of your brain will be ordered in December to check the aneurysm. Please monitor your blood pressure regularly and report any elevations. It is important to understand that the risk of rupture is low, but monitoring is crucial.  INSTRUCTIONS: Continue taking Eliquis  5 mg twice daily for three months. A pharmacist will contact you to discuss cost-effective options for Eliquis . Your prescription will be sent to [facility] on [address]. An MRA of your brain will be ordered in December to monitor the aneurysm. Please monitor your blood pressure regularly and report any elevations.  Your Providers PCP: Cone Bernardino MATSU, MD,  [phone]) Referring Provider: Cone Bernardino MATSU, MD,  [phone]) Care Team Provider: Stacia Glendia BRAVO, MD,  [phone])  NEXT STEPS: [x]  Early Intervention: Schedule sooner appointment, call our on-call services, or go to emergency room if there is any significant Increase in pain or discomfort New or worsening symptoms Sudden or severe changes in your health [x]  Flexible Follow-Up: We recommend a No follow-ups on file. for optimal routine care. This allows for progress monitoring and treatment adjustments. [x]  Preventive Care: Schedule your annual preventive care visit! It's typically covered by insurance and helps identify potential health issues early. [x]  Lab & X-ray Appointments: Incomplete tests scheduled today, or call to schedule. X-rays: [facility] (M-F, 8:30am-noon or 1pm-5pm). [x]  Medical Information Release: Sign a release form at front desk to obtain relevant medical information we don't have.  MAKING THE MOST OF OUR FOCUSED 20 MINUTE APPOINTMENTS: [x]   Clearly state your top concerns at the beginning of the visit to focus our discussion [x]   If you anticipate you will need more time, please inform the front desk during scheduling - we can book multiple appointments in the same week. [x]   If you have transportation problems- use our convenient video appointments or ask about transportation support. [x]   We can get down to business faster if you use MyChart to update information before the visit and submit non-urgent questions before your visit. Thank you for taking the time to provide details through MyChart.  Let our nurse know and she can import this information into your encounter documents.  Arrival and  Wait Times: [x]   Arriving on time ensures that everyone receives prompt attention. [x]   Early morning (8a) and afternoon (1p) appointments tend to have shortest wait times. [x]   Unfortunately, we cannot delay appointments for late  arrivals or hold slots during phone calls.  Getting Answers and Following Up [x]   Simple Questions & Concerns: For quick questions or basic follow-up after your visit, reach us  at [phone] or MyChart messaging. [x]   Complex Concerns: If your concern is more complex, scheduling an appointment might be best. Discuss this with the staff to find the most suitable option. [x]   Lab & Imaging Results: We'll contact you directly if results are abnormal or you don't use MyChart. Most normal results will be on MyChart within 2-3 business days, with a review message from Dr. Jesus. Haven't heard back in 2 weeks? Need results sooner? Contact us  at [phone]. [x]   Referrals: Our referral coordinator will manage specialist referrals. The specialist's office should contact you within 2 weeks to schedule an appointment. Call us  if you haven't heard from them after 2 weeks.  Staying Connected [x]   MyChart: Activate your MyChart for the fastest way to access results and message us . See the last page of this paperwork for instructions on how to activate.  Bring to Your Next Appointment [x]   Medications: Please bring all your medication bottles to your next appointment to ensure we have an accurate record of your prescriptions. [x]   Health Diaries: If you're monitoring any health conditions at home, keeping a diary of your readings can be very helpful for discussions at your next appointment.  Billing [x]   X-ray & Lab Orders: These are billed by separate companies. Contact the invoicing company directly for questions or concerns. [x]   Visit Charges: Discuss any billing inquiries with our administrative services team.  Your Satisfaction Matters [x]   Share Your Experience: We strive for your satisfaction! If you have any complaints, or preferably compliments, please let Dr. Jesus know directly or contact our Practice Administrators, Manuelita Rubin or [employer], by asking at the front desk.    Reviewing Your Records [x]   Review this early draft of your clinical encounter notes below and the final encounter summary tomorrow on MyChart after its been completed.  All orders placed so far are visible here: Acute deep vein thrombosis (DVT) of calf muscle vein of left lower extremity (HCC) -     Apixaban ; Take 2 tablets (10mg ) twice daily for 7 days, then 1 tablet (5mg ) twice daily  Dispense: 60 tablet; Refill: 2 -     AMB Referral [facility]BCI Care Management  Aneurysm of middle cerebral artery -     MR ANGIO NECK W WO CONTRAST; Future

## 2024-08-13 NOTE — Assessment & Plan Note (Signed)
 A 1.5 mm outpouching at the MCA bifurcation was identified on a CT scan, suggesting a small aneurysm. Surgical intervention is not indicated until the aneurysm reaches 7 mm. The aneurysm is asymptomatic with a low risk of rupture. Regular monitoring is recommended to track changes in size. Order an MRA of the brain in December to monitor aneurysm size. Monitor blood pressure regularly and report any elevations. Educate on the low risk of rupture and the importance of monitoring.

## 2024-08-15 ENCOUNTER — Telehealth: Payer: Self-pay | Admitting: *Deleted

## 2024-08-15 NOTE — Progress Notes (Signed)
 Care Guide Pharmacy Note  08/15/2024 Name: KALAB CAMPS MRN: [id number] DOB: [birth year]  Referred By: Jesus Bernardino MATSU, MD Reason for referral: Complex Care Management (Outreach to schedule referral with pharmacist )   Selinda SHAUNNA Levering is a 48 y.o. year old male who is a primary care patient of Jesus Bernardino MATSU, MD.  Selinda SHAUNNA Levering was referred to the pharmacist for assistance related to: DVT  Successful contact was made with the patient to discuss pharmacy services including being ready for the pharmacist to call at least 5 minutes before the scheduled appointment time and to have medication bottles and any blood pressure readings ready for review. The patient agreed to meet with the pharmacist via telephone visit on 08/19/2024  Thedford Franks, CMA [place]  [facility], [facility] Guide Direct Dial: [phone]  Fax: [phone] Website: [email or web address]

## 2024-08-19 ENCOUNTER — Other Ambulatory Visit: Payer: PRIVATE HEALTH INSURANCE | Admitting: Pharmacist

## 2024-08-19 ENCOUNTER — Encounter: Payer: Self-pay | Admitting: Pharmacist

## 2024-08-19 NOTE — Progress Notes (Signed)
 08/19/2024 Name: Dennis [name] MRN: [id number] DOB: Jul 29, 1976  Chief Complaint  Patient presents with   Medication Management    Dennis [name] is a 48 y.o. year old male who presented for a telephone visit.   They were referred to the pharmacist by their PCP for assistance in managing medication access.    Subjective: Patient was traveling by car between [place], [place], [place] and [place], [place] in early August when he noted a painful knot in his left calf. Prevented to ED in at [facility] in [place] where his wife is receiving treatment for cancer. Found to have left calf DVT on ultraound and was started on Eliquis .  Per Dennis [name]'s notes he would like patient to continue Eliquis  therapy for 3 months.  Dennis [name] however is having difficulty with the cost of Eliquis .  He essentially does not have prescription insurance. I called [employer] which is the PBM listed on his insurance card. Their representative states that they use a discount card list GoodRx to lower cost but for Eliquis  his cost is still $619 per fill. Patient can submit paperwork to his medical plan - [employer] and he might be reimbursed for Eliquis  cost but there is no guarantee.    Medication Access/Adherence  Current Pharmacy:  [facility] [address] [place] - [address] [place] [address] Phone: [phone] Fax: [phone]   Patient reports affordability concerns with their medications: Yes  Patient reports access/transportation concerns to their pharmacy: No  Patient reports adherence concerns with their medications:  No        Objective:  No results found for: HGBA1C  Lab Results  Component Value Date   CREATININE 0.93 03/26/2024   BUN 14 03/26/2024   NA 138 03/26/2024   K 4.1 03/26/2024   CL 104 03/26/2024   CO2 27 03/26/2024    Lab Results  Component Value Date   CHOL 254 (H) 09/13/2023   HDL 36 (L) 09/13/2023   LDLCALC 176  (H) 09/13/2023   TRIG 259 (H) 09/13/2023   CHOLHDL 7.1 (H) 09/13/2023    Medications Reviewed Today     Reviewed by Dennis [name], RPH-CPP (Pharmacist) on 08/19/24 at 1454  Med List Status: <None>   Medication Order Taking? Sig Documenting Provider Last Dose Status Informant  apixaban  (ELIQUIS ) 5 MG TABS tablet [id number]  Take 2 tablets (10mg ) twice daily for 7 days, then 1 tablet (5mg ) twice daily Dennis Bernardino MATSU, [name]  Active   LORazepam  (ATIVAN ) 0.5 MG tablet [id number]  Take 1 tablet (0.5 mg total) by mouth at bedtime. Dennis Bernardino MATSU, [name]  Active   LORazepam  (ATIVAN ) 0.5 MG tablet [id number] Yes Take 1 tablet (0.5 mg total) by mouth 2 (two) times daily as needed for anxiety. Dennis Bernardino MATSU, [name]  Active   meclizine  (ANTIVERT ) 25 MG tablet [id number]  Take 1 tablet (25 mg total) by mouth 3 (three) times daily as needed for dizziness. Dennis [name]  Active   Omega-3 Fatty Acids (FISH OIL ) 1000 MG CAPS [id number]  Take 2 capsules (2,000 mg total) by mouth 2 (two) times daily before lunch and supper. Dennis Bernardino MATSU, [name]  Active   ramelteon  (ROZEREM ) 8 MG tablet [id number]  Take 1 tablet (8 mg total) by mouth at bedtime. Dennis Bernardino MATSU, [name]  Active   sertraline  (ZOLOFT ) 100 MG tablet [id number] Yes Take 1 tablet (100 mg total) by mouth daily. Dennis Bernardino MATSU, [name]  Active  Assessment/Plan:   Medication Management / Access - Called insurance / PBM - [employer] Cost of Eliquis  thru them is $619 per month.  - Tried to use free trial offer for 30 days of Eliquis  but it appears patient has used this card already and it is a one time use per lifetime.  - Patient has application for [employer] patient assistance program and [employer] states his household income does qualify. I completed Dennis [name] portion and will mail / email to patient after he signs.  - Provided 1 week of Eliquis  5mg  samples until we can get application completed (patient has about 7 to 10 days of Eliquis  on  hand)   Follow Up Plan: check back in 2 weeks to see if patient assistance program approved.   Dennis [name], PharmD Clinical Pharmacist [facility]  Population Health [phone]

## 2024-08-22 ENCOUNTER — Encounter: Payer: Self-pay | Admitting: Pharmacist

## 2024-08-27 ENCOUNTER — Ambulatory Visit: Payer: PRIVATE HEALTH INSURANCE | Admitting: Internal Medicine

## 2024-09-02 ENCOUNTER — Telehealth: Payer: Self-pay | Admitting: Pharmacist

## 2024-09-02 ENCOUNTER — Other Ambulatory Visit: Payer: PRIVATE HEALTH INSURANCE | Admitting: Pharmacist

## 2024-09-02 DIAGNOSIS — I82462 Acute embolism and thrombosis of left calf muscular vein: Secondary | ICD-10-CM

## 2024-09-02 NOTE — Telephone Encounter (Signed)
 Attempt was made to contact patient by phone today for follow up by Clinical Pharmacist regarding Eliquis  patient assistance program.   Unable to reach patient. LM on VM with my contact number [phone].  Checked with [employer] - they have not received patient portion of application yet. Will send patient message in MyChart.

## 2024-09-11 NOTE — Progress Notes (Addendum)
 09/11/2024 Name: Dennis [name] MRN: [id number] DOB: [birth year]  Chief Complaint  Patient presents with   Medication Management    Dennis [name] is a 48 y.o. year old male who presented for a telephone visit.   They were referred to the pharmacist by their PCP for assistance in managing medication access.    Subjective: Patient was traveling by car between [place], [place], [place] and [place], [place] in early August when he noted a painful knot in his left calf. Prevented to ED in at [facility] in [place] where his wife is receiving treatment for cancer. Found to have left calf DVT on ultraound and was started on Eliquis .  Per DR Morrison's notes he would like patient to continue Eliquis  therapy for 3 months.  [name] however is having difficulty with the cost of Eliquis .  He essentially does not have prescription insurance. I called [employer] which is the PBM listed on his insurance card. Their representative states that they use a discount card like GoodRx to lower cost but for Eliquis  his cost is still $588 per fill. Patient can submit paperwork to his medical plan - [employer] and he might be reimbursed for Eliquis  cost but there is no guarantee.   About 2 weeks ago provided patient with provider portion of [employer]  patient assistance program application with [employer]. He has submitted application. [name] reached out thru MyChart to request samples.    Medication Access/Adherence  Current Pharmacy:  [facility] [address] [place] - [address] [place] [address] Phone: [phone] Fax: [phone]   Patient reports affordability concerns with their medications: Yes  Patient reports access/transportation concerns to their pharmacy: No  Patient reports adherence concerns with their medications:  No        Objective:  No results found for: HGBA1C  Lab Results  Component Value Date   CREATININE  0.93 03/26/2024   BUN 14 03/26/2024   NA 138 03/26/2024   K 4.1 03/26/2024   CL 104 03/26/2024   CO2 27 03/26/2024    Lab Results  Component Value Date   CHOL 254 (H) 09/13/2023   HDL 36 (L) 09/13/2023   LDLCALC 176 (H) 09/13/2023   TRIG 259 (H) 09/13/2023   CHOLHDL 7.1 (H) 09/13/2023   Current Outpatient Medications  Medication Instructions   apixaban  (ELIQUIS ) 5 MG TABS tablet Take 2 tablets (10mg ) twice daily for 7 days, then 1 tablet (5mg ) twice daily   Fish Oil  2,000 mg, Oral, 2 times daily before lunch and supper   LORazepam  (ATIVAN ) 0.5 mg, Oral, Daily at bedtime   LORazepam  (ATIVAN ) 0.5 mg, Oral, 2 times daily PRN   meclizine  (ANTIVERT ) 25 mg, Oral, 3 times daily PRN   ramelteon  (ROZEREM ) 8 mg, Oral, Daily at bedtime   sertraline  (ZOLOFT ) 100 mg, Oral, Daily     Assessment/Plan:   Medication Management / Access - Called [employer] patient assistance program regarding Eliquis  application. Per representative they reviewed application 09/09/2024 but patient was over their income limit when checked with the automated income verification system. Representative said patient could submit the first 2 pages of his most recent income tax records for reconsideration.  - I called [employer] to try to use a copay discount card but patient is not able to use this card with his [employer] / [employer] plan. Cost would be $588 for #60 Eliquis  / 30 day supply.  - Will consult with PCP. Warfarin would likely be most  cost effective but patient would need to have INR checked routinely.  - If converting from Eliquis  to warfarin there are 2 options:   1 - stop Eliquis  and start warfarin 5mg  daily and enoxaparin  1mg /kg every 12 hours. Check INR every 1 to 2 days until INR is > 2.0, then stop enoxaparin   OR  2- Overlap Eliquis  and warfarin for 2 to 3 days (Eliquis  can have some effect on INR)  - Checked but our office does not have any samples of Eliquis  currently.    Madelin Ray,  PharmD Clinical Pharmacist [facility]  Population Health [phone]

## 2024-09-12 MED ORDER — ENOXAPARIN SODIUM 100 MG/ML IJ SOSY: 100.0000 mg | PREFILLED_SYRINGE | INTRAMUSCULAR | 1 refills | Status: DC

## 2024-09-12 NOTE — Addendum Note (Signed)
 Addended by: Lenka Zhao G on: 09/12/2024 11:54 AM   Modules accepted: Orders

## 2024-09-15 ENCOUNTER — Telehealth: Payer: Self-pay | Admitting: Pharmacist

## 2024-09-15 NOTE — Telephone Encounter (Signed)
 Patient called back. He decided to continue Eliquis  and paid high cost instead of starting enoxaparin  injections.

## 2024-09-15 NOTE — Telephone Encounter (Signed)
 Tried to call patient to see if he has any questions about enoxaparin  injections that Dr Jesus called in last week. Unable to reach [employer] but left a message on VM with my CB# [phone] or office number [phone]

## 2024-09-15 NOTE — Addendum Note (Signed)
 Addended by: CARLA MILLING B on: 09/15/2024 03:29 PM   Modules accepted: Orders

## 2024-10-27 ENCOUNTER — Encounter: Payer: Self-pay | Admitting: Internal Medicine

## 2024-11-27 ENCOUNTER — Other Ambulatory Visit: Payer: Self-pay | Admitting: Internal Medicine

## 2024-11-27 DIAGNOSIS — F411 Generalized anxiety disorder: Secondary | ICD-10-CM

## 2024-11-27 DIAGNOSIS — F3342 Major depressive disorder, recurrent, in full remission: Secondary | ICD-10-CM
